# Patient Record
Sex: Male | Born: 1980 | Race: White | Hispanic: No | Marital: Married | Smoking: Never smoker
Health system: Southern US, Community
[De-identification: ages and names within clinical notes are randomized; demographics above are authoritative.]

## PROBLEM LIST (undated history)

## (undated) DIAGNOSIS — I10 Essential (primary) hypertension: Secondary | ICD-10-CM

## (undated) DIAGNOSIS — K5090 Crohn's disease, unspecified, without complications: Secondary | ICD-10-CM

## (undated) HISTORY — PX: FASCIOTOMY: SHX132

## (undated) HISTORY — PX: FACIAL FRACTURE SURGERY: SHX1570

---

## 2003-09-15 ENCOUNTER — Emergency Department (HOSPITAL_COMMUNITY): Admission: EM | Admit: 2003-09-15 | Discharge: 2003-09-15 | Payer: Self-pay | Admitting: Emergency Medicine

## 2006-05-10 ENCOUNTER — Emergency Department: Payer: Self-pay | Admitting: Internal Medicine

## 2006-09-16 ENCOUNTER — Emergency Department: Payer: Self-pay | Admitting: General Practice

## 2007-01-30 ENCOUNTER — Emergency Department: Payer: Self-pay | Admitting: Emergency Medicine

## 2007-05-29 ENCOUNTER — Emergency Department: Payer: Self-pay | Admitting: Emergency Medicine

## 2007-05-30 ENCOUNTER — Emergency Department: Payer: Self-pay | Admitting: Emergency Medicine

## 2007-07-16 ENCOUNTER — Emergency Department: Payer: Self-pay | Admitting: Emergency Medicine

## 2007-07-26 ENCOUNTER — Emergency Department: Payer: Self-pay | Admitting: Emergency Medicine

## 2007-07-30 ENCOUNTER — Inpatient Hospital Stay: Payer: Self-pay | Admitting: Internal Medicine

## 2007-12-19 ENCOUNTER — Emergency Department: Payer: Self-pay | Admitting: Emergency Medicine

## 2008-06-08 ENCOUNTER — Emergency Department: Payer: Self-pay | Admitting: Emergency Medicine

## 2008-08-11 ENCOUNTER — Emergency Department: Payer: Self-pay | Admitting: Emergency Medicine

## 2008-08-26 ENCOUNTER — Emergency Department: Payer: Self-pay | Admitting: Emergency Medicine

## 2008-09-20 ENCOUNTER — Emergency Department: Payer: Self-pay | Admitting: Emergency Medicine

## 2008-10-07 ENCOUNTER — Emergency Department: Payer: Self-pay | Admitting: Emergency Medicine

## 2009-03-05 ENCOUNTER — Emergency Department: Payer: Self-pay | Admitting: Emergency Medicine

## 2009-04-10 ENCOUNTER — Inpatient Hospital Stay: Payer: Self-pay | Admitting: *Deleted

## 2009-07-07 ENCOUNTER — Emergency Department: Payer: Self-pay | Admitting: Emergency Medicine

## 2009-10-12 ENCOUNTER — Emergency Department: Payer: Self-pay | Admitting: Emergency Medicine

## 2010-06-07 ENCOUNTER — Emergency Department: Payer: Self-pay | Admitting: Unknown Physician Specialty

## 2010-06-18 ENCOUNTER — Emergency Department: Payer: Self-pay | Admitting: Emergency Medicine

## 2013-07-25 ENCOUNTER — Emergency Department: Payer: Self-pay | Admitting: Emergency Medicine

## 2013-12-02 ENCOUNTER — Emergency Department: Payer: Self-pay | Admitting: Emergency Medicine

## 2013-12-02 LAB — CK TOTAL AND CKMB (NOT AT ARMC)
CK, Total: 173 U/L
CK-MB: 2.3 ng/mL (ref 0.5–3.6)

## 2013-12-02 LAB — COMPREHENSIVE METABOLIC PANEL
ALK PHOS: 95 U/L
Albumin: 3.6 g/dL (ref 3.4–5.0)
Anion Gap: 7 (ref 7–16)
BUN: 12 mg/dL (ref 7–18)
Bilirubin,Total: 0.9 mg/dL (ref 0.2–1.0)
CO2: 29 mmol/L (ref 21–32)
Calcium, Total: 9.1 mg/dL (ref 8.5–10.1)
Chloride: 101 mmol/L (ref 98–107)
Creatinine: 1.41 mg/dL — ABNORMAL HIGH (ref 0.60–1.30)
EGFR (Non-African Amer.): >60
Glucose: 77 mg/dL (ref 65–99)
OSMOLALITY: 272 (ref 275–301)
POTASSIUM: 4.2 mmol/L (ref 3.5–5.1)
SGOT(AST): 147 U/L — ABNORMAL HIGH (ref 15–37)
SGPT (ALT): 201 U/L — ABNORMAL HIGH (ref 12–78)
Sodium: 137 mmol/L (ref 136–145)
TOTAL PROTEIN: 7.7 g/dL (ref 6.4–8.2)

## 2013-12-02 LAB — CBC
HCT: 48.7 % (ref 40.0–52.0)
HGB: 16.3 g/dL (ref 13.0–18.0)
MCH: 29.1 pg (ref 26.0–34.0)
MCHC: 33.5 g/dL (ref 32.0–36.0)
MCV: 87 fL (ref 80–100)
Platelet: 200 10*3/uL (ref 150–440)
RBC: 5.62 10*6/uL (ref 4.40–5.90)
RDW: 13.4 % (ref 11.5–14.5)
WBC: 6.2 10*3/uL (ref 3.8–10.6)

## 2014-09-12 ENCOUNTER — Ambulatory Visit: Payer: Self-pay

## 2015-04-05 ENCOUNTER — Emergency Department
Admission: EM | Admit: 2015-04-05 | Discharge: 2015-04-05 | Disposition: A | Discharge status: Home / Self Care | Payer: Self-pay | Attending: Emergency Medicine | Admitting: Emergency Medicine

## 2015-04-05 DIAGNOSIS — K0889 Other specified disorders of teeth and supporting structures: Secondary | ICD-10-CM

## 2015-04-05 DIAGNOSIS — K088 Other specified disorders of teeth and supporting structures: Secondary | ICD-10-CM | POA: Insufficient documentation

## 2015-04-05 DIAGNOSIS — Z880 Allergy status to penicillin: Secondary | ICD-10-CM | POA: Insufficient documentation

## 2015-04-05 MED ORDER — OXYCODONE-ACETAMINOPHEN 5-325 MG PO TABS: 1.0000 | ORAL_TABLET | ORAL | Status: DC | PRN

## 2015-04-05 MED ORDER — AMOXICILLIN-POT CLAVULANATE 875-125 MG PO TABS: 1.0000 | ORAL_TABLET | Freq: Two times a day (BID) | ORAL | Status: AC

## 2015-04-05 MED ORDER — OXYCODONE-ACETAMINOPHEN 5-325 MG PO TABS
1.0000 | ORAL_TABLET | Freq: Once | ORAL | Status: AC
  Administered 2015-04-05: 1 via ORAL
  Filled 2015-04-05: qty 1

## 2015-04-05 NOTE — ED Notes (Signed)
Pt informed to return if any life threatening symptoms occur.  

## 2015-04-05 NOTE — Discharge Instructions (Signed)
Please seek medical attention for any high fevers, chest pain, shortness of breath, change in behavior, persistent vomiting, bloody stool or any other new or concerning symptoms.  Dental Pain A tooth ache may be caused by cavities (tooth decay). Cavities expose the nerve of the tooth to air and hot or cold temperatures. It may come from an infection or abscess (also called a boil or furuncle) around your tooth. It is also often caused by dental caries (tooth decay). This causes the pain you are having. DIAGNOSIS  Your caregiver can diagnose this problem by exam. TREATMENT   If caused by an infection, it may be treated with medications which kill germs (antibiotics) and pain medications as prescribed by your caregiver. Take medications as directed.  Only take over-the-counter or prescription medicines for pain, discomfort, or fever as directed by your caregiver.  Whether the tooth ache today is caused by infection or dental disease, you should see your dentist as soon as possible for further care. SEEK MEDICAL CARE IF: The exam and treatment you received today has been provided on an emergency basis only. This is not a substitute for complete medical or dental care. If your problem worsens or new problems (symptoms) appear, and you are unable to meet with your dentist, call or return to this location. SEEK IMMEDIATE MEDICAL CARE IF:   You have a fever.  You develop redness and swelling of your face, jaw, or neck.  You are unable to open your mouth.  You have severe pain uncontrolled by pain medicine. MAKE SURE YOU:   Understand these instructions.  Will watch your condition.  Will get help right away if you are not doing well or get worse. Document Released: 08/31/2005 Document Revised: 11/23/2011 Document Reviewed: 04/18/2008 [facility] Patient Information 2015 [place], [place]. This information is not intended to replace advice given to you by your health care provider. Make sure you  discuss any questions you have with your health care provider.

## 2015-04-05 NOTE — ED Provider Notes (Signed)
[facility] Emergency Department Provider Note  ____________________________________________  Time seen: 0715  I have reviewed the triage vital signs and the nursing notes.   HISTORY  Chief Complaint Dental Pain   History limited by: Not Limited   HPI Tommy Gomez is a 34 y.o. male who presents to the emergency department today because of right upper teeth pain and swelling. He states this been going on for 3 days. It has gradually gotten worse. He describes pain as being severe. It is worse with movement or pressure. He denies any recent trauma to his tooth although this does state that he thinks he cracked his tooth a while ago. He states he does have an appointment with a dentist in 1 week to evaluate for a loose incisor secondary to baseball injury. He complained of some low-grade fever, no nausea, vomiting or diarrhea.     No past medical history on file.  There are no active problems to display for this patient.   No past surgical history on file.  No current outpatient prescriptions on file.  Allergies Penicillins  No family history on file.  Social History History  Substance Use Topics  . Smoking status: Not on file  . Smokeless tobacco: Not on file  . Alcohol Use: Not on file    Review of Systems  Constitutional: Negative for fever. Cardiovascular: Negative for chest pain. Respiratory: Negative for shortness of breath. Gastrointestinal: Negative for abdominal pain, vomiting and diarrhea. Genitourinary: Negative for dysuria. Musculoskeletal: Negative for back pain. Skin: Negative for rash. Neurological: Negative for headaches, focal weakness or numbness.  10-point ROS otherwise negative.  ____________________________________________   PHYSICAL EXAM:  VITAL SIGNS: ED Triage Vitals  Enc Vitals Group     BP 04/05/15 0639 165/109 mmHg     Pulse Rate 04/05/15 0639 93     Resp 04/05/15 0639 17     Temp 04/05/15 0639  98.1 F (36.7 C)     Temp Source 04/05/15 0639 Oral     SpO2 04/05/15 0639 96 %     Weight --      Height --      Head Cir --      Peak Flow --      Pain Score 04/05/15 0625 10   Constitutional: Alert and oriented. Appears slightly uncomfortable. Eyes: Conjunctivae are normal. PERRL. Normal extraocular movements. ENT   Head: Normocephalic and atraumatic.   Nose: No congestion/rhinnorhea.   Mouth/Throat: Mucous membranes are moist. No obvious pharyngeal swelling/tooth swelling. Tender to palpation of the right upper molars. No fluid discharge.    Neck: No stridor. Hematological/Lymphatic/Immunilogical: No cervical lymphadenopathy. Cardiovascular: Normal rate, regular rhythm.   Respiratory: Normal respiratory effort without tachypnea nor retractions. Genitourinary: Deferred Musculoskeletal: Normal range of motion in all extremities.  Neurologic:  Normal speech and language. No gross focal neurologic deficits are appreciated. Speech is normal.  Skin:  Skin is warm, dry and intact. No rash noted. Psychiatric: Mood and affect are normal. Speech and behavior are normal. Patient exhibits appropriate insight and judgment.  ____________________________________________    LABS (pertinent positives/negatives)  None  ____________________________________________   EKG  None  ____________________________________________    RADIOLOGY  None  ____________________________________________   PROCEDURES  Procedure(s) performed: None  Critical Care performed: No  ____________________________________________   INITIAL IMPRESSION / ASSESSMENT AND PLAN / ED COURSE  Pertinent labs & imaging results that were available during my care of the patient were reviewed by me and considered in my medical  decision making (see chart for details).  Patient presents to the emergency department today because of right upper teeth pain. On exam patient does have tenderness to  palpation of the right upper molars without any obvious swelling or tooth injury. This point the patient likely suffered from a dental infection. Will place on antibiotics and given pain medication. Did encourage earlier follow-up with dentist.  ____________________________________________   FINAL CLINICAL IMPRESSION(S) / ED DIAGNOSES  Final diagnoses:  Tooth ache     Nance Pear, MD 04/05/15 [phone]

## 2015-04-05 NOTE — ED Notes (Signed)
Pt with c/o abcess to right mouth, stated he has broken tooth (stated dentist is unable to pull tooth until infection has cleared up). Girlfriend stated swelling started yesterday, and pain woke pt up out of his sleep at 0200.

## 2015-04-05 NOTE — ED Notes (Signed)
Onset of dental pain on the right side upper molar. Right face is swollen and patient states he cannot eat or sleep. Teeth do no meet when mouth is closed.

## 2015-10-08 ENCOUNTER — Encounter: Payer: Self-pay | Admitting: Emergency Medicine

## 2015-10-08 ENCOUNTER — Emergency Department: Payer: Self-pay

## 2015-10-08 ENCOUNTER — Emergency Department
Admission: EM | Admit: 2015-10-08 | Discharge: 2015-10-08 | Disposition: A | Discharge status: Home / Self Care | Payer: Self-pay | Attending: Emergency Medicine | Admitting: Emergency Medicine

## 2015-10-08 DIAGNOSIS — R109 Unspecified abdominal pain: Secondary | ICD-10-CM | POA: Insufficient documentation

## 2015-10-08 DIAGNOSIS — Z880 Allergy status to penicillin: Secondary | ICD-10-CM | POA: Insufficient documentation

## 2015-10-08 DIAGNOSIS — R112 Nausea with vomiting, unspecified: Secondary | ICD-10-CM | POA: Insufficient documentation

## 2015-10-08 DIAGNOSIS — I10 Essential (primary) hypertension: Secondary | ICD-10-CM | POA: Insufficient documentation

## 2015-10-08 HISTORY — DX: Essential (primary) hypertension: I10

## 2015-10-08 HISTORY — DX: Crohn's disease, unspecified, without complications: K50.90

## 2015-10-08 LAB — LIPASE, BLOOD: Lipase: 21 U/L (ref 11–51)

## 2015-10-08 LAB — COMPREHENSIVE METABOLIC PANEL
ALBUMIN: 4.7 g/dL (ref 3.5–5.0)
ALK PHOS: 73 U/L (ref 38–126)
ALT: 59 U/L (ref 17–63)
AST: 44 U/L — ABNORMAL HIGH (ref 15–41)
Anion gap: 9 (ref 5–15)
BILIRUBIN TOTAL: 0.7 mg/dL (ref 0.3–1.2)
BUN: 25 mg/dL — ABNORMAL HIGH (ref 6–20)
CALCIUM: 9.5 mg/dL (ref 8.9–10.3)
CO2: 25 mmol/L (ref 22–32)
Chloride: 106 mmol/L (ref 101–111)
Creatinine, Ser: 1.08 mg/dL (ref 0.61–1.24)
GFR calc Af Amer: >60 mL/min (ref >60)
GFR calc non Af Amer: >60 mL/min (ref >60)
GLUCOSE: 104 mg/dL — AB (ref 65–99)
POTASSIUM: 4 mmol/L (ref 3.5–5.1)
Sodium: 140 mmol/L (ref 135–145)
TOTAL PROTEIN: 8.1 g/dL (ref 6.5–8.1)

## 2015-10-08 LAB — CBC
HEMATOCRIT: 45.9 % (ref 40.0–52.0)
Hemoglobin: 16.1 g/dL (ref 13.0–18.0)
MCH: 30.9 pg (ref 26.0–34.0)
MCHC: 35.1 g/dL (ref 32.0–36.0)
MCV: 88.1 fL (ref 80.0–100.0)
Platelets: 213 10*3/uL (ref 150–440)
RBC: 5.21 MIL/uL (ref 4.40–5.90)
RDW: 12.3 % (ref 11.5–14.5)
WBC: 15 10*3/uL — ABNORMAL HIGH (ref 3.8–10.6)

## 2015-10-08 MED ORDER — IOHEXOL 240 MG/ML SOLN
25.0000 mL | Freq: Once | INTRAMUSCULAR | Status: AC | PRN
  Administered 2015-10-08: 25 mL via ORAL
  Filled 2015-10-08: qty 25

## 2015-10-08 MED ORDER — ONDANSETRON HCL 4 MG PO TABS: 4.0000 mg | ORAL_TABLET | Freq: Three times a day (TID) | ORAL | Status: AC | PRN

## 2015-10-08 MED ORDER — PROMETHAZINE HCL 25 MG/ML IJ SOLN
INTRAMUSCULAR | Status: AC
  Administered 2015-10-08: 25 mg via INTRAVENOUS
  Filled 2015-10-08: qty 1

## 2015-10-08 MED ORDER — DICYCLOMINE HCL 20 MG PO TABS: 20.0000 mg | ORAL_TABLET | Freq: Three times a day (TID) | ORAL | Status: AC | PRN

## 2015-10-08 MED ORDER — IOHEXOL 300 MG/ML  SOLN
100.0000 mL | Freq: Once | INTRAMUSCULAR | Status: AC | PRN
  Administered 2015-10-08: 100 mL via INTRAVENOUS
  Filled 2015-10-08: qty 100

## 2015-10-08 MED ORDER — SODIUM CHLORIDE 0.9 % IV BOLUS (SEPSIS)
1000.0000 mL | Freq: Once | INTRAVENOUS | Status: AC
  Administered 2015-10-08: 1000 mL via INTRAVENOUS

## 2015-10-08 MED ORDER — PROMETHAZINE HCL 25 MG/ML IJ SOLN
25.0000 mg | Freq: Once | INTRAMUSCULAR | Status: AC
  Administered 2015-10-08: 25 mg via INTRAVENOUS

## 2015-10-08 MED ORDER — ONDANSETRON HCL 4 MG/2ML IJ SOLN
4.0000 mg | Freq: Once | INTRAMUSCULAR | Status: AC
  Administered 2015-10-08: 4 mg via INTRAVENOUS

## 2015-10-08 NOTE — ED Notes (Signed)
Pt to ed with c/o abd pain and n/v/d that started about 1 week ago intermittently.  Pt also reports dizziness, sob associated with the pain.

## 2015-10-08 NOTE — ED Provider Notes (Signed)
[facility] Emergency Department Provider Note    ____________________________________________  Time seen: 1500  I have reviewed the triage vital signs and the nursing notes.   HISTORY  Chief Complaint Abdominal Pain   History limited by: Not Limited   HPI Tommy Gomez is a 35 y.o. male who presents to the emergency department because of concerns for abdominal pain, nausea and vomiting. He states that the symptoms started roughly 4-5 days ago. They have been somewhat intermittent. He states the pain becomes severe. He describes it is pulling in quality. Will also be sharp at times. He is not as anything that particularly makes the pain worse. He has had associated symptoms of nausea and vomiting. He states he has noticed some blood in the vomit. He denies any significant change to his defecation. He has had subjective fevers at home. He states he had similar symptoms roughly 6 years in the past and was diagnosed with IBS.   Past Medical History  Diagnosis Date  . Crohn disease ([place])   . Hypertension     There are no active problems to display for this patient.   History reviewed. No pertinent past surgical history.  Current Outpatient Rx  Name  Route  Sig  Dispense  Refill  . oxyCODONE-acetaminophen (ROXICET) 5-325 MG per tablet   Oral   Take 1 tablet by mouth every 4 (four) hours as needed for severe pain.   15 tablet   0     Allergies Penicillins  History reviewed. No pertinent family history.  Social History Social History  Substance Use Topics  . Smoking status: Never Smoker   . Smokeless tobacco: None  . Alcohol Use: Yes    Review of Systems  Constitutional: Negative for fever. Cardiovascular: Negative for chest pain. Respiratory: Negative for shortness of breath. Gastrointestinal: Positive for abdominal pain, vomiting Neurological: Negative for headaches, focal weakness or numbness.  10-point ROS otherwise  negative.  ____________________________________________   PHYSICAL EXAM:  VITAL SIGNS: ED Triage Vitals  Enc Vitals Group     BP 10/08/15 1337 137/92 mmHg     Pulse Rate 10/08/15 1337 98     Resp 10/08/15 1337 18     Temp 10/08/15 1337 98.2 F (36.8 C)     Temp Source 10/08/15 1337 Oral     SpO2 10/08/15 1337 97 %     Weight 10/08/15 1337 240 lb (108.863 kg)     Height 10/08/15 1337 6' 1"  (1.854 m)     Head Cir --      Peak Flow --      Pain Score 10/08/15 1337 10   Constitutional: Alert and oriented. Well appearing and in no distress. Eyes: Conjunctivae are normal. PERRL. Normal extraocular movements. ENT   Head: Normocephalic and atraumatic.   Nose: No congestion/rhinnorhea.   Mouth/Throat: Mucous membranes are moist.   Neck: No stridor. Hematological/Lymphatic/Immunilogical: No cervical lymphadenopathy. Cardiovascular: Normal rate, regular rhythm.  No murmurs, rubs, or gallops. Respiratory: Normal respiratory effort without tachypnea nor retractions. Breath sounds are clear and equal bilaterally. No wheezes/rales/rhonchi. Gastrointestinal: Soft and minimally tender somewhat diffusely however worse in the right lower quadrant. No distention. There is no CVA tenderness. Genitourinary: Deferred Musculoskeletal: Normal range of motion in all extremities. No joint effusions.  No lower extremity tenderness nor edema. Neurologic:  Normal speech and language. No gross focal neurologic deficits are appreciated.  Skin:  Skin is warm, dry and intact. No rash noted. Psychiatric: Mood and affect are normal.  Speech and behavior are normal. Patient exhibits appropriate insight and judgment.  ____________________________________________    LABS (pertinent positives/negatives)  Labs Reviewed  COMPREHENSIVE METABOLIC PANEL - Abnormal; Notable for the following:    Glucose, Bld 104 (*)    BUN 25 (*)    AST 44 (*)    All other components within normal limits  CBC -  Abnormal; Notable for the following:    WBC 15.0 (*)    All other components within normal limits  LIPASE, BLOOD  URINALYSIS COMPLETEWITH MICROSCOPIC ([facility] ONLY)     ____________________________________________   EKG  I, Nance Pear, attending physician, personally viewed and interpreted this EKG  EKG Time: 1340 Rate: 105 Rhythm: sinus tachycardia Axis: normal Intervals: qtc 452 QRS: narrow ST changes: no st elevation Impression: sinus tachycardia, otherwise normal ____________________________________________    RADIOLOGY  CT abd/pel IMPRESSION: Normal appendix.  No acute findings in the abdomen or pelvis.   ____________________________________________   PROCEDURES  Procedure(s) performed: None  Critical Care performed: No  ____________________________________________   INITIAL IMPRESSION / ASSESSMENT AND PLAN / ED COURSE  Pertinent labs & imaging results that were available during my care of the patient were reviewed by me and considered in my medical decision making (see chart for details).  Patient presented to the emergency department today because of concerns for abdominal pain. Patient does state he has a history of IBS. On physical exam slightly tender to palpation diffusely however worse in the right lower quadrant. Patient additionally with a leukocytosis given these 2 findings a CT abdomen and pelvis was obtained to evaluate for appendicitis. This was negative. No obvious etiology of the pain based on CT scan. I do wonder if patient is having a slight flare of his IBS. Will plan on giving Bentyl and antibiotics. Will give  primary care follow-up.  ____________________________________________   FINAL CLINICAL IMPRESSION(S) / ED DIAGNOSES  Final diagnoses:  Abdominal pain, unspecified abdominal location     Nance Pear, MD 10/08/15 [name]

## 2015-10-08 NOTE — Discharge Instructions (Signed)
Please seek medical attention for any high fevers, chest pain, shortness of breath, change in behavior, persistent vomiting, bloody stool or any other new or concerning symptoms.   Abdominal Pain, Adult Many things can cause abdominal pain. Usually, abdominal pain is not caused by a disease and will improve without treatment. It can often be observed and treated at home. Your health care provider will do a physical exam and possibly order blood tests and X-rays to help determine the seriousness of your pain. However, in many cases, more time must pass before a clear cause of the pain can be found. Before that point, your health care provider may not know if you need more testing or further treatment. HOME CARE INSTRUCTIONS Monitor your abdominal pain for any changes. The following actions may help to alleviate any discomfort you are experiencing:  Only take over-the-counter or prescription medicines as directed by your health care provider.  Do not take laxatives unless directed to do so by your health care provider.  Try a clear liquid diet (broth, tea, or water) as directed by your health care provider. Slowly move to a bland diet as tolerated. SEEK MEDICAL CARE IF:  You have unexplained abdominal pain.  You have abdominal pain associated with nausea or diarrhea.  You have pain when you urinate or have a bowel movement.  You experience abdominal pain that wakes you in the night.  You have abdominal pain that is worsened or improved by eating food.  You have abdominal pain that is worsened with eating fatty foods.  You have a fever. SEEK IMMEDIATE MEDICAL CARE IF:  Your pain does not go away within 2 hours.  You keep throwing up (vomiting).  Your pain is felt only in portions of the abdomen, such as the right side or the left lower portion of the abdomen.  You pass bloody or black tarry stools. MAKE SURE YOU:  Understand these instructions.  Will watch your  condition.  Will get help right away if you are not doing well or get worse.   This information is not intended to replace advice given to you by your health care provider. Make sure you discuss any questions you have with your health care provider.   Document Released: 06/10/2005 Document Revised: 05/22/2015 Document Reviewed: 05/10/2013 Elsevier Interactive Patient Education [employer].

## 2015-11-23 ENCOUNTER — Emergency Department
Admission: EM | Admit: 2015-11-23 | Discharge: 2015-11-23 | Disposition: A | Discharge status: Home / Self Care | Payer: Self-pay | Attending: Emergency Medicine | Admitting: Emergency Medicine

## 2015-11-23 DIAGNOSIS — I10 Essential (primary) hypertension: Secondary | ICD-10-CM | POA: Insufficient documentation

## 2015-11-23 DIAGNOSIS — K5090 Crohn's disease, unspecified, without complications: Secondary | ICD-10-CM | POA: Insufficient documentation

## 2015-11-23 DIAGNOSIS — L02414 Cutaneous abscess of left upper limb: Secondary | ICD-10-CM | POA: Insufficient documentation

## 2015-11-23 MED ORDER — LIDOCAINE-EPINEPHRINE 2 %-1:100000 IJ SOLN
1.7000 mL | Freq: Once | INTRAMUSCULAR | Status: AC
  Administered 2015-11-23: 1.7 mL

## 2015-11-23 MED ORDER — SULFAMETHOXAZOLE-TRIMETHOPRIM 800-160 MG PO TABS: 1.0000 | ORAL_TABLET | Freq: Two times a day (BID) | ORAL | Status: DC

## 2015-11-23 MED ORDER — LIDOCAINE-EPINEPHRINE 2 %-1:100000 IJ SOLN
INTRAMUSCULAR | Status: AC
  Administered 2015-11-23: 1.7 mL
  Filled 2015-11-23: qty 1.7

## 2015-11-23 NOTE — ED Provider Notes (Signed)
CSN: [id number]     Arrival date & time 11/23/15  1517 History   None    Chief Complaint  Patient presents with  . Abscess     (Consider location/radiation/quality/duration/timing/severity/associated sxs/prior Treatment) HPI  35 year old male presents emergency department for evaluation of left painful forearm. He has had a area of soft tissue swelling consistent with abscess formation for one week. States he has been taking amoxicillin himself and has seen some improvement. Overall swelling has improved slightly. His pain is moderate. Abscess is located along the ulnar aspect of the proximal forearm medially. There has been no drainage. He denies any numbness or tingling. Denies any IV drug use.  Past Medical History  Diagnosis Date  . Crohn disease ([place])   . Hypertension    No past surgical history on file. No family history on file. Social History  Substance Use Topics  . Smoking status: Never Smoker   . Smokeless tobacco: Not on file  . Alcohol Use: Yes    Review of Systems  Constitutional: Negative.  Negative for fever, chills, activity change and appetite change.  HENT: Negative for congestion, ear pain, mouth sores, rhinorrhea, sinus pressure, sore throat and trouble swallowing.   Eyes: Negative for photophobia, pain and discharge.  Respiratory: Negative for cough, chest tightness and shortness of breath.   Cardiovascular: Negative for chest pain and leg swelling.  Gastrointestinal: Negative for nausea, vomiting, abdominal pain, diarrhea and abdominal distention.  Genitourinary: Negative for dysuria and difficulty urinating.  Musculoskeletal: Negative for back pain, arthralgias and gait problem.  Skin: Positive for wound. Negative for color change and rash.  Neurological: Negative for dizziness and headaches.  Hematological: Negative for adenopathy.  Psychiatric/Behavioral: Negative for behavioral problems and agitation.      Allergies  Penicillins  Home  Medications   Prior to Admission medications   Medication Sig Start Date End Date Taking? Authorizing Provider  dicyclomine (BENTYL) 20 MG tablet Take 1 tablet (20 mg total) by mouth 3 (three) times daily as needed (abdominal pain). 10/08/15 10/07/16  Nance Pear, MD  ondansetron (ZOFRAN) 4 MG tablet Take 1 tablet (4 mg total) by mouth every 8 (eight) hours as needed. 10/08/15   Nance Pear, MD  oxyCODONE-acetaminophen (ROXICET) 5-325 MG per tablet Take 1 tablet by mouth every 4 (four) hours as needed for severe pain. 04/05/15   Nance Pear, MD  sulfamethoxazole-trimethoprim (BACTRIM DS,SEPTRA DS) 800-160 MG tablet Take 1 tablet by mouth 2 (two) times daily. 11/23/15   Duanne Guess, PA-C   BP 140/94 mmHg  Pulse 95  Temp(Src) 98.4 F (36.9 C) (Oral)  Resp 18  Ht 6' 1"  (1.854 m)  Wt 107.956 kg  BMI 31.41 kg/m2  SpO2 100% Physical Exam  Constitutional: He is oriented to person, place, and time. He appears well-developed and well-nourished.  HENT:  Head: Normocephalic and atraumatic.  Eyes: Conjunctivae and EOM are normal. Pupils are equal, round, and reactive to light.  Neck: Normal range of motion. Neck supple.  Cardiovascular: Normal rate, regular rhythm, normal heart sounds and intact distal pulses.   Pulmonary/Chest: Effort normal and breath sounds normal. No respiratory distress.  Musculoskeletal: Normal range of motion. He exhibits no edema or tenderness.  Full range of motion left upper extremity with no discomfort.  Neurological: He is alert and oriented to person, place, and time.  Skin: Skin is warm and dry.  Left proximal forearm, ulnar aspect shows a 4 x 4 centimeter area of induration and erythema. There is no  streaking. No signs of IV drug abuse.  Psychiatric: He has a normal mood and affect. His behavior is normal. Judgment and thought content normal.    ED Course  Procedures (including critical care time) INCISION AND DRAINAGE Performed by: Feliberto Gottron Consent: Verbal consent obtained. Risks and benefits: risks, benefits and alternatives were discussed Type: abscess  Body area: Left forearm  Anesthesia: local infiltration  Incision was made with a scalpel.  Local anesthetic: lidocaine 1% with epinephrine  Anesthetic total: 1.0 ml  Complexity: complex Blunt dissection to break up loculations  Drainage: purulent  Drainage amount:  mild  Packing material: 1/4 in iodoform gauze  Patient tolerance: Patient tolerated the procedure well with no immediate complications.      Labs Review Labs Reviewed - No data to display  Imaging Review No results found. I have personally reviewed and evaluated these images and lab results as part of my medical decision-making.   EKG Interpretation None      MDM   Final diagnoses:  Abscess of left arm     35 year old male with left forearm abscess for one week. Denies any IV drug abuse. Today, erythema and induration 4 cm in diameter. Skin was marked with a skin pen. He agreed and consented to a percentage and drainage procedure. Minimal, 2-3 cc of purulent drainage was expressed. Wound was packed with iodoform. Patient was neurovascularly intact after the procedure. Patient will follow-up in 2-3 days for wound check and packing removal. He will take Bactrim DS 1 tab by mouth twice a day for 7 days. Turn to the ER for any worsening symptoms or urgent changes in his health.    Duanne Guess, PA-C 11/23/15 4035  Lavonia Drafts, MD 11/23/15 [phone]

## 2015-11-23 NOTE — ED Notes (Addendum)
Pt reports red swollen painful area to left forearm that has been there about a week. Denies drainage

## 2015-11-23 NOTE — Discharge Instructions (Signed)
Abscess An abscess is an infected area that contains a collection of pus and debris.It can occur in almost any part of the body. An abscess is also known as a furuncle or boil. CAUSES  An abscess occurs when tissue gets infected. This can occur from blockage of oil or sweat glands, infection of hair follicles, or a minor injury to the skin. As the body tries to fight the infection, pus collects in the area and creates pressure under the skin. This pressure causes pain. People with weakened immune systems have difficulty fighting infections and get certain abscesses more often.  SYMPTOMS Usually an abscess develops on the skin and becomes a painful mass that is red, warm, and tender. If the abscess forms under the skin, you may feel a moveable soft area under the skin. Some abscesses break open (rupture) on their own, but most will continue to get worse without care. The infection can spread deeper into the body and eventually into the bloodstream, causing you to feel ill.  DIAGNOSIS  Your caregiver will take your medical history and perform a physical exam. A sample of fluid may also be taken from the abscess to determine what is causing your infection. TREATMENT  Your caregiver may prescribe antibiotic medicines to fight the infection. However, taking antibiotics alone usually does not cure an abscess. Your caregiver may need to make a small cut (incision) in the abscess to drain the pus. In some cases, gauze is packed into the abscess to reduce pain and to continue draining the area. HOME CARE INSTRUCTIONS   Only take over-the-counter or prescription medicines for pain, discomfort, or fever as directed by your caregiver.  If you were prescribed antibiotics, take them as directed. Finish them even if you start to feel better.  If gauze is used, follow your caregiver's directions for changing the gauze.  To avoid spreading the infection:  Keep your draining abscess covered with a  bandage.  Wash your hands well.  Do not share personal care items, towels, or whirlpools with others.  Avoid skin contact with others.  Keep your skin and clothes clean around the abscess.  Keep all follow-up appointments as directed by your caregiver. SEEK MEDICAL CARE IF:   You have increased pain, swelling, redness, fluid drainage, or bleeding.  You have muscle aches, chills, or a general ill feeling.  You have a fever. MAKE SURE YOU:   Understand these instructions.  Will watch your condition.  Will get help right away if you are not doing well or get worse.   This information is not intended to replace advice given to you by your health care provider. Make sure you discuss any questions you have with your health care provider.   Document Released: 06/10/2005 Document Revised: 03/01/2012 Document Reviewed: 11/13/2011 Elsevier Interactive Patient Education 2016 Else[employer]  Incision and Drainage Incision and drainage is a procedure in which a sac-like structure (cystic structure) is opened and drained. The area to be drained usually contains material such as pus, fluid, or blood.  LET YOUR CAREGIVER KNOW ABOUT:   Allergies to medicine.  Medicines taken, including vitamins, herbs, eyedrops, over-the-counter medicines, and creams.  Use of steroids (by mouth or creams).  Previous problems with anesthetics or numbing medicines.  History of bleeding problems or blood clots.  Previous surgery.  Other health problems, including diabetes and kidney problems.  Possibility of pregnancy, if this applies. RISKS AND COMPLICATIONS  Pain.  Bleeding.  Scarring.  Infection. BEFORE THE PROCEDURE  You may need to have an ultrasound or other imaging tests to see how large or deep your cystic structure is. Blood tests may also be used to determine if you have an infection or how severe the infection is. You may need to have a tetanus shot. PROCEDURE  The affected area  is cleaned with a cleaning fluid. The cyst area will then be numbed with a medicine (local anesthetic). A small incision will be made in the cystic structure. A syringe or catheter may be used to drain the contents of the cystic structure, or the contents may be squeezed out. The area will then be flushed with a cleansing solution. After cleansing the area, it is often gently packed with a gauze or another wound dressing. Once it is packed, it will be covered with gauze and tape or some other type of wound dressing. AFTER THE PROCEDURE   Often, you will be allowed to go home right after the procedure.  You may be given antibiotic medicine to prevent or heal an infection.  If the area was packed with gauze or some other wound dressing, you will likely need to come back in 1 to 2 days to get it removed.  The area should heal in about 14 days.   This information is not intended to replace advice given to you by your health care provider. Make sure you discuss any questions you have with your health care provider.   Document Released: 02/24/2001 Document Revised: 03/01/2012 Document Reviewed: 10/26/2011 Elsevier Interactive Patient Education [employer].

## 2016-04-26 ENCOUNTER — Emergency Department
Admission: EM | Admit: 2016-04-26 | Discharge: 2016-04-26 | Disposition: A | Discharge status: Home / Self Care | Payer: Self-pay | Attending: Emergency Medicine | Admitting: Emergency Medicine

## 2016-04-26 ENCOUNTER — Encounter: Payer: Self-pay | Admitting: *Deleted

## 2016-04-26 DIAGNOSIS — W57XXXA Bitten or stung by nonvenomous insect and other nonvenomous arthropods, initial encounter: Secondary | ICD-10-CM

## 2016-04-26 DIAGNOSIS — T63441A Toxic effect of venom of bees, accidental (unintentional), initial encounter: Secondary | ICD-10-CM | POA: Insufficient documentation

## 2016-04-26 DIAGNOSIS — F17200 Nicotine dependence, unspecified, uncomplicated: Secondary | ICD-10-CM | POA: Insufficient documentation

## 2016-04-26 DIAGNOSIS — I10 Essential (primary) hypertension: Secondary | ICD-10-CM | POA: Insufficient documentation

## 2016-04-26 DIAGNOSIS — T63481A Toxic effect of venom of other arthropod, accidental (unintentional), initial encounter: Secondary | ICD-10-CM

## 2016-04-26 MED ORDER — PREDNISONE 10 MG PO TABS: ORAL_TABLET | ORAL | 0 refills | Status: DC

## 2016-04-26 MED ORDER — FAMOTIDINE 20 MG PO TABS: 20.0000 mg | ORAL_TABLET | Freq: Every day | ORAL | 0 refills | Status: AC

## 2016-04-26 MED ORDER — CETIRIZINE HCL 10 MG PO TABS: 10.0000 mg | ORAL_TABLET | Freq: Every day | ORAL | 0 refills | Status: DC

## 2016-04-26 MED ORDER — FAMOTIDINE IN NACL 20-0.9 MG/50ML-% IV SOLN
20.0000 mg | Freq: Once | INTRAVENOUS | Status: AC
  Administered 2016-04-26: 20 mg via INTRAVENOUS
  Filled 2016-04-26: qty 50

## 2016-04-26 MED ORDER — EPINEPHRINE 0.3 MG/0.3ML IJ SOAJ: 0.3000 mg | Freq: Once | INTRAMUSCULAR | 0 refills | Status: AC

## 2016-04-26 MED ORDER — METHYLPREDNISOLONE SODIUM SUCC 125 MG IJ SOLR
80.0000 mg | Freq: Once | INTRAMUSCULAR | Status: AC
  Administered 2016-04-26: 80 mg via INTRAVENOUS
  Filled 2016-04-26: qty 2

## 2016-04-26 NOTE — ED Triage Notes (Signed)
Pt was stung on right forearm Thursday by a bee, right forearm is red and swollen, no other symptoms noted

## 2016-04-26 NOTE — ED Provider Notes (Signed)
[facility] Emergency Department Provider Note  ____________________________________________  Time seen: Approximately 2:18 PM  I have reviewed the triage vital signs and the nursing notes.   HISTORY  Chief Complaint Insect Bite    HPI Tommy Gomez is a 35 y.o. male , NAD, presents to the emergency department with 3 day history of yellow jacket sting to the right forearm. Patient states he was at work and moving brush when he saw a yellowjacket sting him on his right forearm. States he had a small area of redness at that time. Since then the area of redness has expanded and feels tight. Patient has taken Benadryl 1-2 times daily since the incident. Notes that he had one episode of emesis on Thursday and Friday but none since. Has had no difficulty breathing or swallowing. Has been eating and drinking well. Denies any chest pain, shortness breath, abdominal pain, numbness, weakness, tingling. Has noted no swelling about his lips/tongue/throat. History of allergies to stings in the past.   Past Medical History:  Diagnosis Date  . Crohn disease ([place])   . Hypertension     There are no active problems to display for this patient.   History reviewed. No pertinent surgical history.  Prior to Admission medications   Medication Sig Start Date End Date Taking? Authorizing Provider  cetirizine (ZYRTEC) 10 MG tablet Take 1 tablet (10 mg total) by mouth daily. 04/26/16   Marikay Roads L Saxon Crosby, PA-C  dicyclomine (BENTYL) 20 MG tablet Take 1 tablet (20 mg total) by mouth 3 (three) times daily as needed (abdominal pain). 10/08/15 10/07/16  Nance Pear, MD  EPINEPHrine 0.3 mg/0.3 mL IJ SOAJ injection Inject 0.3 mLs (0.3 mg total) into the muscle once. 04/26/16 04/26/16  Moris Ratchford L Jamiyla Ishee, PA-C  famotidine (PEPCID) 20 MG tablet Take 1 tablet (20 mg total) by mouth daily. 04/26/16 05/26/16  Shanyla Marconi L Michelle Wnek, PA-C  ondansetron (ZOFRAN) 4 MG tablet Take 1 tablet (4 mg total) by mouth every 8  (eight) hours as needed. 10/08/15   Nance Pear, MD  oxyCODONE-acetaminophen (ROXICET) 5-325 MG per tablet Take 1 tablet by mouth every 4 (four) hours as needed for severe pain. 04/05/15   Nance Pear, MD  predniSONE (DELTASONE) 10 MG tablet Take a daily regimen of 6,5,4,3,2,1 04/26/16   Lathyn Griggs L Shaquana Buel, PA-C  sulfamethoxazole-trimethoprim (BACTRIM DS,SEPTRA DS) 800-160 MG tablet Take 1 tablet by mouth 2 (two) times daily. 11/23/15   Duanne Guess, PA-C    Allergies Penicillins  No family history on file.  Social History Social History  Substance Use Topics  . Smoking status: Never Smoker  . Smokeless tobacco: Current User  . Alcohol use Yes     Review of Systems  Constitutional: No fever/chills, Fatigue Eyes: No visual changes. No discharge, Swelling ENT: No sore throat, Swelling about throat/tongue/lips, no difficulty swallowing. Cardiovascular: No chest pain, palpitations. Respiratory: No cough. No shortness of breath. No wheezing.  Gastrointestinal: Positive vomiting 2 that has resolved. No abdominal pain.  No nausea.   Musculoskeletal: Negative for back pain.  Skin: Positive redness right forearm. Negative for rash. Neurological: Negative for headaches, focal weakness or numbness. No tingling. 10-point ROS otherwise negative.  ____________________________________________   PHYSICAL EXAM:  VITAL SIGNS: ED Triage Vitals  Enc Vitals Group     BP 04/26/16 1404 (!) 149/88     Pulse Rate 04/26/16 1404 79     Resp 04/26/16 1404 18     Temp 04/26/16 1404 97.6 F (36.4 C)  Temp Source 04/26/16 1404 Oral     SpO2 04/26/16 1404 100 %     Weight 04/26/16 1405 242 lb (109.8 kg)     Height 04/26/16 1405 6' (1.829 m)     Head Circumference --      Peak Flow --      Pain Score --      Pain Loc --      Pain Edu? --      Excl. in [place]? --      Constitutional: Alert and oriented. Well appearing and in no acute distress. Eyes: Conjunctivae are normal without icterus  or injection Head: Atraumatic. ENT:      Ears:       Nose: No congestion/rhinnorhea.      Mouth/Throat: Mucous membranes are moist. Airways patent. No swelling about the lips/tongue/throat. Neck: No stridor. No carotid bruits. Supple with full range of motion. Hematological/Lymphatic/Immunilogical: No cervical lymphadenopathy. Cardiovascular: Normal rate, regular rhythm. Normal S1 and S2.  Good peripheral circulation with 2+ pulses noted in the right upper extremity. Capillary refill is brisk in all digits of the right hand. Respiratory: Normal respiratory effort without tachypnea or retractions. Lungs CTAB with breath sounds noted in all lung fields. No wheeze, rhonchi, rales. Gastrointestinal: Soft and nontender. No distention. No CVA tenderness. Musculoskeletal: Full range of motion of the right shoulder, elbow, wrist, hand and fingers without pain or difficulty. Compartments are soft about the right upper extremity. Grip strength is 5 out of 5 in bilateral upper extremities. No lower extremity tenderness nor edema.  No joint effusions. Neurologic:  Normal speech and language. No gross focal neurologic deficits are appreciated.  Skin:  5 cm annular area of erythema noted about the anterior, proximal right forearm with a pinpoint central punctate lesion. Area is slightly warm to palpate as well as indurated but no fluctuance or oozing wounds are noted. Skin is warm, dry and intact. No rash noted. Psychiatric: Mood and affect are normal. Speech and behavior are normal. Patient exhibits appropriate insight and judgement.   ____________________________________________   LABS  None ____________________________________________  EKG  None ____________________________________________  RADIOLOGY  None ____________________________________________    PROCEDURES  Procedure(s) performed: None   Procedures   Medications  methylPREDNISolone sodium succinate (SOLU-MEDROL) 125 mg/2 mL  injection 80 mg (80 mg Intravenous Given 04/26/16 1502)  famotidine (PEPCID) IVPB 20 mg premix (0 mg Intravenous Stopped 04/26/16 1554)   Patient tolerated medications well without side effects  ____________________________________________   INITIAL IMPRESSION / ASSESSMENT AND PLAN / ED COURSE  Pertinent labs & imaging results that were available during my care of the patient were reviewed by me and considered in my medical decision making (see chart for details).  Clinical Course    Patient's diagnosis is consistent with Insect sting allergy with local reaction. Patient will be discharged home with prescriptions for prednisone, Zyrtec and Pepcid to take as directed. Patient may also take 50 mg of Benadryl every 4-6 hours as needed for itching. Apply cool compresses to the area as needed. Patient was also given a prescription for an EpiPen to use as needed for severe reactions. 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 Patient is to follow up with [facility] if symptoms persist past this treatment course. Patient is given ED precautions to return to the ED for any worsening or new symptoms.    ____________________________________________  FINAL CLINICAL IMPRESSION(S) / ED DIAGNOSES  Final diagnoses:  Insect bite  Insect sting allergy, current reaction, accidental or unintentional, initial encounter      NEW MEDICATIONS STARTED DURING THIS VISIT:  Discharge Medication List as of 04/26/2016  3:58 PM    START taking these medications   Details  cetirizine (ZYRTEC) 10 MG tablet Take 1 tablet (10 mg total) by mouth daily., Starting Sun 04/26/2016, Print    EPINEPHrine 0.3 mg/0.3 mL IJ SOAJ injection Inject 0.3 mLs (0.3 mg total) into the muscle once., Starting Sun 04/26/2016, Print    famotidine (PEPCID) 20 MG tablet Take 1 tablet (20 mg total) by mouth daily., Starting Sun 04/26/2016, Until Tue 05/26/2016, Print    predniSONE (DELTASONE) 10 MG  tablet Take a daily regimen of 6,5,4,3,2,1, Print             Braxton Feathers, PA-C 04/26/16 1641    Harvest Dark, MD 04/27/16 2336

## 2016-08-02 ENCOUNTER — Encounter: Payer: Self-pay | Admitting: Emergency Medicine

## 2016-08-02 ENCOUNTER — Emergency Department
Admission: EM | Admit: 2016-08-02 | Discharge: 2016-08-02 | Disposition: A | Discharge status: Home / Self Care | Payer: Self-pay | Attending: Student in an Organized Health Care Education/Training Program | Admitting: Student in an Organized Health Care Education/Training Program

## 2016-08-02 DIAGNOSIS — Z79899 Other long term (current) drug therapy: Secondary | ICD-10-CM | POA: Insufficient documentation

## 2016-08-02 DIAGNOSIS — R51 Headache: Secondary | ICD-10-CM | POA: Insufficient documentation

## 2016-08-02 DIAGNOSIS — F17200 Nicotine dependence, unspecified, uncomplicated: Secondary | ICD-10-CM | POA: Insufficient documentation

## 2016-08-02 DIAGNOSIS — G8929 Other chronic pain: Secondary | ICD-10-CM

## 2016-08-02 DIAGNOSIS — R519 Headache, unspecified: Secondary | ICD-10-CM

## 2016-08-02 DIAGNOSIS — I10 Essential (primary) hypertension: Secondary | ICD-10-CM | POA: Insufficient documentation

## 2016-08-02 MED ORDER — AMOXICILLIN-POT CLAVULANATE 875-125 MG PO TABS: 1.0000 | ORAL_TABLET | Freq: Two times a day (BID) | ORAL | 0 refills | Status: AC

## 2016-08-02 MED ORDER — BUTALBITAL-APAP-CAFFEINE 50-325-40 MG PO TABS: 1.0000 | ORAL_TABLET | Freq: Four times a day (QID) | ORAL | 0 refills | Status: DC | PRN

## 2016-08-02 MED ORDER — DEXAMETHASONE 4 MG PO TABS
6.0000 mg | ORAL_TABLET | Freq: Once | ORAL | Status: AC
  Administered 2016-08-02: 6 mg via ORAL
  Filled 2016-08-02: qty 1.5

## 2016-08-02 NOTE — ED Notes (Signed)
States took some of his mom bp meds two days ago and it helped the head pressure.

## 2016-08-02 NOTE — ED Notes (Signed)
Patient states that he has had headache and blurry vision. Patient denies numbness and tingling, fevers and chills. Patient does have nasal congestion. Patient has right sided facial tenderness on palpation

## 2016-08-02 NOTE — ED Provider Notes (Signed)
[facility] Emergency Department Provider Note    First MD Initiated Contact with Patient 08/02/16 1649     (approximate)  I have reviewed the triage vital signs and the nursing notes.   HISTORY  Chief Complaint Headache    HPI Tommy Gomez is a 35 y.o. male with 2 weeks of intermittent headache that occurs more frequently while at work. Patient works for a Adult nurse. States there is no been no sudden onset headache. States is typically on the right side radiating through to his posterior scalp. I does have intermittent blurry vision during the headache attacks. Does not have any blurry vision at this time. No associated nausea or vomiting. States she's been taking Excedrin Aleve and Mucinex with some temporary relief but the headaches come back. Patient states his blood pressures also been elevated and took one of his mother's blood pressure medication and felt that that did have some improvement in his headache. He denies any numbness or tingling at this time.   Past Medical History:  Diagnosis Date  . Crohn disease ([place])   . Hypertension    History reviewed. No pertinent family history. History reviewed. No pertinent surgical history. There are no active problems to display for this patient.     Prior to Admission medications   Medication Sig Start Date End Date Taking? Authorizing Provider  cetirizine (ZYRTEC) 10 MG tablet Take 1 tablet (10 mg total) by mouth daily. 04/26/16   Jami L Hagler, PA-C  dicyclomine (BENTYL) 20 MG tablet Take 1 tablet (20 mg total) by mouth 3 (three) times daily as needed (abdominal pain). 10/08/15 10/07/16  Nance Pear, MD  famotidine (PEPCID) 20 MG tablet Take 1 tablet (20 mg total) by mouth daily. 04/26/16 05/26/16  Jami L Hagler, PA-C  ondansetron (ZOFRAN) 4 MG tablet Take 1 tablet (4 mg total) by mouth every 8 (eight) hours as needed. 10/08/15   Nance Pear, MD  oxyCODONE-acetaminophen (ROXICET) 5-325 MG per  tablet Take 1 tablet by mouth every 4 (four) hours as needed for severe pain. 04/05/15   Nance Pear, MD  predniSONE (DELTASONE) 10 MG tablet Take a daily regimen of 6,5,4,3,2,1 04/26/16   Jami L Hagler, PA-C  sulfamethoxazole-trimethoprim (BACTRIM DS,SEPTRA DS) 800-160 MG tablet Take 1 tablet by mouth 2 (two) times daily. 11/23/15   Duanne Guess, PA-C    Allergies Penicillins    Social History Social History  Substance Use Topics  . Smoking status: Never Smoker  . Smokeless tobacco: Current User  . Alcohol use Yes    Review of Systems Patient denies headaches, rhinorrhea, blurry vision, numbness, shortness of breath, chest pain, edema, cough, abdominal pain, nausea, vomiting, diarrhea, dysuria, fevers, rashes or hallucinations unless otherwise stated above in HPI. ____________________________________________   PHYSICAL EXAM:  VITAL SIGNS: Vitals:   08/02/16 1622  BP: (!) 174/100  Pulse: 63  Resp: 18  Temp: 98 F (36.7 C)    Constitutional: Alert and oriented. Well appearing and in no acute distress. Eyes: Conjunctivae are normal. PERRL. EOMI.  No proptosis Head: Atraumatic.  Right sided maxillary and frontal sinus ttp Nose: No congestion/rhinnorhea. Mouth/Throat: Mucous membranes are moist.  Oropharynx non-erythematous. Neck: No stridor. Painless ROM. No cervical spine tenderness to palpation Hematological/Lymphatic/Immunilogical: No cervical lymphadenopathy. Cardiovascular: Normal rate, regular rhythm. Grossly normal heart sounds.  Good peripheral circulation. Respiratory: Normal respiratory effort.  No retractions. Lungs CTAB. Gastrointestinal: Soft and nontender. No distention. No abdominal bruits. No CVA tenderness. Genitourinary:  Musculoskeletal: No  lower extremity tenderness nor edema.  No joint effusions. Neurologic:  CN- intact.  No facial droop, Normal FNF.  Normal heel to shin.  Sensation intact bilaterally. Normal speech and language. No gross focal  neurologic deficits are appreciated. No gait instability.  Skin:  Skin is warm, dry and intact. No rash noted. Psychiatric: Mood and affect are normal. Speech and behavior are normal.  ____________________________________________   LABS (all labs ordered are listed, but only abnormal results are displayed)  No results found for this or any previous visit (from the past 24 hour(s)). ____________________________________________  EKG____________________________________________  RADIOLOGY   ____________________________________________   PROCEDURES  Procedure(s) performed: none Procedures    Critical Care performed: no ____________________________________________   INITIAL IMPRESSION / ASSESSMENT AND PLAN / ED COURSE  Pertinent labs & imaging results that were available during my care of the patient were reviewed by me and considered in my medical decision making (see chart for details).  DDX: migraine, tension, sinusitis, abscess, mass, sah, iph,   Tommy Gomez is a 35 y.o. who presents to the ED p/w HA for last 14 days. Not worst HA ever. Gradual onset,  Comes and goes.  Right sided with radiation to right  Denies focal neurologic symptoms. Denies trauma. No fevers or neck pain. No vision loss. Afebrile in ED. VSS. Exam as above. No meningeal signs. No CN, motor, sensory or cerebellar deficits. Temporal arteries palpable and non-tender. Appears well and non-toxic.  Likely tension, non-specific or possible migraine HA. Given frontal tenderness to palpation and duration and he suspect a component of acute bacterial sinusitis. I did recommend we order CT imaging of the head to evaluate for any calm. Sinusitis such as abscess or extension through the cranium. Patient has declined this up referring to have a trial of outpatient management. Clinical picture is not consistent with ICH, SAH, SDH, EDH, TIA, or CVA. No concern for meningitis or encephalitis. No concern for GCA/Temporal  arteritis.  neuro exam is again without focal deficit, nuchal rigidity or evidence of meningeal irritation.  Stable to D/C home, follow up with PCP or Neurology if persistent recurrent Has.  Have discussed with the patient and available family all diagnostics and treatments performed thus far and all questions were answered to the best of my ability. The patient demonstrates understanding and agreement with plan.    Clinical Course      ____________________________________________   FINAL CLINICAL IMPRESSION(S) / ED DIAGNOSES  Final diagnoses:  Chronic nonintractable headache, unspecified headache type      NEW MEDICATIONS STARTED DURING THIS VISIT:  New Prescriptions   No medications on file     Note:  This document was prepared using Dragon voice recognition software and may include unintentional dictation errors.    Merlyn Lot, MD 08/02/16 [phone]

## 2016-08-02 NOTE — ED Notes (Signed)
Headache behind his rt eye - has been taking excedrin, aleve, mucinex x 2 weeks with no relief. bp is elevated today. States used to take hctz but states his bp got better and stopped taking.

## 2016-08-02 NOTE — ED Triage Notes (Signed)
States headache on and off x 2 weeks - no history of migraines.

## 2016-08-02 NOTE — Discharge Instructions (Signed)

## 2016-08-24 ENCOUNTER — Emergency Department: Payer: Self-pay

## 2016-08-24 ENCOUNTER — Emergency Department
Admission: EM | Admit: 2016-08-24 | Discharge: 2016-08-24 | Disposition: A | Discharge status: Home / Self Care | Payer: Self-pay | Attending: Emergency Medicine | Admitting: Emergency Medicine

## 2016-08-24 DIAGNOSIS — B9689 Other specified bacterial agents as the cause of diseases classified elsewhere: Secondary | ICD-10-CM

## 2016-08-24 DIAGNOSIS — J180 Bronchopneumonia, unspecified organism: Secondary | ICD-10-CM | POA: Insufficient documentation

## 2016-08-24 DIAGNOSIS — J329 Chronic sinusitis, unspecified: Secondary | ICD-10-CM | POA: Insufficient documentation

## 2016-08-24 DIAGNOSIS — F17200 Nicotine dependence, unspecified, uncomplicated: Secondary | ICD-10-CM | POA: Insufficient documentation

## 2016-08-24 DIAGNOSIS — I10 Essential (primary) hypertension: Secondary | ICD-10-CM | POA: Insufficient documentation

## 2016-08-24 DIAGNOSIS — Z79899 Other long term (current) drug therapy: Secondary | ICD-10-CM | POA: Insufficient documentation

## 2016-08-24 DIAGNOSIS — J019 Acute sinusitis, unspecified: Secondary | ICD-10-CM

## 2016-08-24 MED ORDER — AMOXICILLIN-POT CLAVULANATE 875-125 MG PO TABS: 1.0000 | ORAL_TABLET | Freq: Two times a day (BID) | ORAL | 0 refills | Status: DC

## 2016-08-24 MED ORDER — CEFTRIAXONE SODIUM 1 G IJ SOLR
1.0000 g | Freq: Once | INTRAMUSCULAR | Status: AC
  Administered 2016-08-24: 1 g via INTRAMUSCULAR
  Filled 2016-08-24: qty 10

## 2016-08-24 MED ORDER — PREDNISONE 50 MG PO TABS: 50.0000 mg | ORAL_TABLET | Freq: Every day | ORAL | 0 refills | Status: DC

## 2016-08-24 MED ORDER — AZITHROMYCIN 250 MG PO TABS: ORAL_TABLET | ORAL | 0 refills | Status: DC

## 2016-08-24 MED ORDER — FLUTICASONE PROPIONATE 50 MCG/ACT NA SUSP: 1.0000 | Freq: Two times a day (BID) | NASAL | 0 refills | Status: DC

## 2016-08-24 MED ORDER — ALBUTEROL SULFATE HFA 108 (90 BASE) MCG/ACT IN AERS: 2.0000 | INHALATION_SPRAY | RESPIRATORY_TRACT | 0 refills | Status: DC | PRN

## 2016-08-24 MED ORDER — CETIRIZINE HCL 10 MG PO TABS: 10.0000 mg | ORAL_TABLET | Freq: Every day | ORAL | 0 refills | Status: DC

## 2016-08-24 NOTE — ED Notes (Signed)

## 2016-08-24 NOTE — ED Triage Notes (Addendum)
Pt reports that he is having left side/back pain with congested cough (productive cough/yellow mucus) - reports foul odor to mucus and fould taste in throat - c/o rib pain and soreness - pain increases with inspiration - pt has been sick for 3 weeks

## 2016-08-24 NOTE — ED Notes (Signed)
Pt reports that he is having left side/back pain with congested cough (productive cough/yellow mucus) - reports foul odor to mucus and fould taste in throat - c/o rib pain and soreness - pain increases with inspiration - pt has been sick for 3 weeks

## 2016-08-24 NOTE — ED Provider Notes (Signed)
[facility] Emergency Department Provider Note  ____________________________________________  Time seen: Approximately 8:18 PM  I have reviewed the triage vital signs and the nursing notes.   HISTORY  Chief Complaint Cough    HPI Tommy Gomez is a 35 y.o. male who presents emergency department complaining of sinus congestion, sinus pressure, coughing, left rib pain. Patient states that symptoms have been ongoing times several weeks. Patient reports that he had a history of sinus infection a few months ago. He received antibiotics for same and symptoms resolved. Patient reports that sinus congestion and pressure returned approximately 3 weeks ago. He also developed a cough that is worsen. Patient reports intermittent subjective, tactile fevers with sweating. Patient reports that cough is productive. He denies any difficulty breathing, chest pain, dull pain, nausea or vomiting. Patient has not take any medications for this complaint prior to arrival.   Past Medical History:  Diagnosis Date  . Crohn disease ([place])   . Hypertension     There are no active problems to display for this patient.   History reviewed. No pertinent surgical history.  Prior to Admission medications   Medication Sig Start Date End Date Taking? Authorizing Provider  albuterol (PROVENTIL HFA;VENTOLIN HFA) 108 (90 Base) MCG/ACT inhaler Inhale 2 puffs into the lungs every 4 (four) hours as needed for wheezing or shortness of breath. 08/24/16   Charline Bills Merrell Rettinger, PA-C  amoxicillin-clavulanate (AUGMENTIN) 875-125 MG tablet Take 1 tablet by mouth 2 (two) times daily. 08/24/16   Charline Bills Ahnya Akre, PA-C  azithromycin (ZITHROMAX Z-PAK) 250 MG tablet Take 2 tablets (500 mg) on  Day 1,  followed by 1 tablet (250 mg) once daily on Days 2 through 5. 08/24/16   Roderic Palau D Lamiya Naas, PA-C  butalbital-acetaminophen-caffeine (FIORICET, ESGIC) 50-325-40 MG tablet Take 1-2 tablets by mouth every 6  (six) hours as needed for headache. 08/02/16 08/02/17  Merlyn Lot, MD  cetirizine (ZYRTEC) 10 MG tablet Take 1 tablet (10 mg total) by mouth daily. 08/24/16   Charline Bills Janey Petron, PA-C  dicyclomine (BENTYL) 20 MG tablet Take 1 tablet (20 mg total) by mouth 3 (three) times daily as needed (abdominal pain). 10/08/15 10/07/16  Nance Pear, MD  famotidine (PEPCID) 20 MG tablet Take 1 tablet (20 mg total) by mouth daily. 04/26/16 05/26/16  Jami L Hagler, PA-C  fluticasone (FLONASE) 50 MCG/ACT nasal spray Place 1 spray into both nostrils 2 (two) times daily. 08/24/16   Charline Bills Cyndel Griffey, PA-C  ondansetron (ZOFRAN) 4 MG tablet Take 1 tablet (4 mg total) by mouth every 8 (eight) hours as needed. 10/08/15   Nance Pear, MD  oxyCODONE-acetaminophen (ROXICET) 5-325 MG per tablet Take 1 tablet by mouth every 4 (four) hours as needed for severe pain. 04/05/15   Nance Pear, MD  predniSONE (DELTASONE) 50 MG tablet Take 1 tablet (50 mg total) by mouth daily with breakfast. 08/24/16   Charline Bills Asusena Sigley, PA-C  sulfamethoxazole-trimethoprim (BACTRIM DS,SEPTRA DS) 800-160 MG tablet Take 1 tablet by mouth 2 (two) times daily. 11/23/15   Duanne Guess, PA-C    Allergies Patient has no active allergies.  No family history on file.  Social History Social History  Substance Use Topics  . Smoking status: Never Smoker  . Smokeless tobacco: Current User  . Alcohol use No     Review of Systems  Constitutional: No fever/chills Eyes: No visual changes. No discharge ENT: Positive for nasal congestion with sinus pressure. Cardiovascular: no chest pain. Respiratory: Positive for productive. No SOB.  Gastrointestinal: No abdominal pain.  No nausea, no vomiting.  Musculoskeletal: Positive for left-sided rib pain Skin: Negative for rash, abrasions, lacerations, ecchymosis. Neurological: Negative for headaches, focal weakness or numbness. 10-point ROS otherwise  negative.  ____________________________________________   PHYSICAL EXAM:  VITAL SIGNS: ED Triage Vitals [08/24/16 1859]  Enc Vitals Group     BP      Pulse      Resp      Temp      Temp src      SpO2      Weight 240 lb (108.9 kg)     Height 6' 1"  (1.854 m)     Head Circumference      Peak Flow      Pain Score 6     Pain Loc      Pain Edu?      Excl. in Point?      Constitutional: Alert and oriented. Well appearing and in no acute distress. Eyes: Conjunctivae are normal. PERRL. EOMI. Head: Atraumatic. ENT:      Ears:       Nose: Moderate purulent congestion/rhinnorhea. Patient is tender to percussion over the frontal and maxillary sinuses.      Mouth/Throat: Mucous membranes are moist. Oropharynx is mildly erythematous but nonedematous. Uvula is midline. Tonsils are mildly erythematous but nonedematous and no exudates are present. Neck: No stridor. Neck is supple full range of motion Hematological/Lymphatic/Immunilogical: Diffuse, mobile, nontender anterior cervical lymphadenopathy. Cardiovascular: Normal rate, regular rhythm. Normal S1 and S2.  Good peripheral circulation. Respiratory: Normal respiratory effort without tachypnea or retractions. Lungs expiratory wheezing to left lower lung field. No rales or rhonchi. Good air entry to the bases with no decreased or absent breath sounds. Musculoskeletal: Full range of motion to all extremities. No gross deformities appreciated. Neurologic:  Normal speech and language. No gross focal neurologic deficits are appreciated.  Skin:  Skin is warm, dry and intact. No rash noted. Psychiatric: Mood and affect are normal. Speech and behavior are normal. Patient exhibits appropriate insight and judgement.   ____________________________________________   LABS (all labs ordered are listed, but only abnormal results are displayed)  Labs Reviewed - No data to  display ____________________________________________  EKG   ____________________________________________  RADIOLOGY Diamantina Providence Yarielis Funaro, personally viewed and evaluated these images (plain radiographs) as part of my medical decision making, as well as reviewing the written report by the radiologist.  Dg Chest 2 View  Result Date: 08/24/2016 CLINICAL DATA:  Pt reports that he is having left side/back pain with congested cough (productive cough/yellow mucus) - reports foul odor to mucus and fould taste in throat - c/o rib pain and soreness - pain increases with inspiration - pt has been sick for 3 weeks EXAM: CHEST  2 VIEW COMPARISON:  12/02/2013 FINDINGS: There are small ill-defined nodular opacities in the left lower lobe associated with irregular thickening of the bronchovascular structures. This suggest bronchopneumonia. Remainder of the lungs is clear. No pleural effusion. No pneumothorax. The heart, mediastinum and hila are unremarkable. Skeletal structures are unremarkable. IMPRESSION: Findings consistent with left lower lobe bronchopneumonia. Electronically Signed   By: Lajean Manes M.D.   On: 08/24/2016 19:49    ____________________________________________    PROCEDURES  Procedure(s) performed:    Procedures    Medications  cefTRIAXone (ROCEPHIN) injection 1 g (not administered)     ____________________________________________   INITIAL IMPRESSION / ASSESSMENT AND PLAN / ED COURSE  Pertinent labs & imaging results that were available during my care  of the patient were reviewed by me and considered in my medical decision making (see chart for details).  Review of the [place] CSRS was performed in accordance of the [place] prior to dispensing any controlled drugs.  Clinical Course     Patient's diagnosis is consistent with Acute bacterial sinusitis and left lower bronchopneumonia. X-ray reveals proximal pneumonia to left lower lobe. Exam reveals acute bacterial  sinusitis. Patient is given shot of Rocephin in the emergency department he'll be discharged home with oral antibiotics, Flonase, Zyrtec, prednisone, albuterol inhaler..Patient has had reoccurring bacterial sinusitis. The patient's symptoms persist, he'll follow up with ENT provider. Patient is given ED precautions to return to the ED for any worsening or new symptoms.     ____________________________________________  FINAL CLINICAL IMPRESSION(S) / ED DIAGNOSES  Final diagnoses:  Acute bacterial sinusitis  Bronchopneumonia      NEW MEDICATIONS STARTED DURING THIS VISIT:  New Prescriptions   ALBUTEROL (PROVENTIL HFA;VENTOLIN HFA) 108 (90 BASE) MCG/ACT INHALER    Inhale 2 puffs into the lungs every 4 (four) hours as needed for wheezing or shortness of breath.   AMOXICILLIN-CLAVULANATE (AUGMENTIN) 875-125 MG TABLET    Take 1 tablet by mouth 2 (two) times daily.   AZITHROMYCIN (ZITHROMAX Z-PAK) 250 MG TABLET    Take 2 tablets (500 mg) on  Day 1,  followed by 1 tablet (250 mg) once daily on Days 2 through 5.   CETIRIZINE (ZYRTEC) 10 MG TABLET    Take 1 tablet (10 mg total) by mouth daily.   FLUTICASONE (FLONASE) 50 MCG/ACT NASAL SPRAY    Place 1 spray into both nostrils 2 (two) times daily.   PREDNISONE (DELTASONE) 50 MG TABLET    Take 1 tablet (50 mg total) by mouth daily with breakfast.        This chart was dictated using voice recognition software/Dragon. Despite best efforts to proofread, errors can occur which can change the meaning. Any change was purely unintentional.    Darletta Moll, PA-C 08/24/16 2035    Eula Listen, MD 08/24/16 2206

## 2016-09-22 ENCOUNTER — Emergency Department
Admission: EM | Admit: 2016-09-22 | Discharge: 2016-09-22 | Disposition: A | Discharge status: Home / Self Care | Payer: Self-pay | Attending: Emergency Medicine | Admitting: Emergency Medicine

## 2016-09-22 DIAGNOSIS — R109 Unspecified abdominal pain: Secondary | ICD-10-CM | POA: Insufficient documentation

## 2016-09-22 DIAGNOSIS — Z79899 Other long term (current) drug therapy: Secondary | ICD-10-CM | POA: Insufficient documentation

## 2016-09-22 DIAGNOSIS — R110 Nausea: Secondary | ICD-10-CM | POA: Insufficient documentation

## 2016-09-22 DIAGNOSIS — Z5321 Procedure and treatment not carried out due to patient leaving prior to being seen by health care provider: Secondary | ICD-10-CM | POA: Insufficient documentation

## 2016-09-22 DIAGNOSIS — F17290 Nicotine dependence, other tobacco product, uncomplicated: Secondary | ICD-10-CM | POA: Insufficient documentation

## 2016-09-22 DIAGNOSIS — I10 Essential (primary) hypertension: Secondary | ICD-10-CM | POA: Insufficient documentation

## 2016-09-22 LAB — LIPASE, BLOOD: Lipase: 41 U/L (ref 11–51)

## 2016-09-22 LAB — CBC
HEMATOCRIT: 46.5 % (ref 40.0–52.0)
Hemoglobin: 16.4 g/dL (ref 13.0–18.0)
MCH: 31.6 pg (ref 26.0–34.0)
MCHC: 35.3 g/dL (ref 32.0–36.0)
MCV: 89.7 fL (ref 80.0–100.0)
Platelets: 172 10*3/uL (ref 150–440)
RBC: 5.19 MIL/uL (ref 4.40–5.90)
RDW: 13.8 % (ref 11.5–14.5)
WBC: 4.3 10*3/uL (ref 3.8–10.6)

## 2016-09-22 LAB — COMPREHENSIVE METABOLIC PANEL
ALBUMIN: 3.8 g/dL (ref 3.5–5.0)
ALT: 174 U/L — AB (ref 17–63)
AST: 306 U/L — AB (ref 15–41)
Alkaline Phosphatase: 110 U/L (ref 38–126)
Anion gap: 9 (ref 5–15)
BILIRUBIN TOTAL: 1.5 mg/dL — AB (ref 0.3–1.2)
BUN: 15 mg/dL (ref 6–20)
CO2: 28 mmol/L (ref 22–32)
Calcium: 8.4 mg/dL — ABNORMAL LOW (ref 8.9–10.3)
Chloride: 100 mmol/L — ABNORMAL LOW (ref 101–111)
Creatinine, Ser: 1.1 mg/dL (ref 0.61–1.24)
GFR calc Af Amer: >60 mL/min (ref >60)
GFR calc non Af Amer: >60 mL/min (ref >60)
GLUCOSE: 128 mg/dL — AB (ref 65–99)
POTASSIUM: 4 mmol/L (ref 3.5–5.1)
Sodium: 137 mmol/L (ref 135–145)
TOTAL PROTEIN: 7.6 g/dL (ref 6.5–8.1)

## 2016-09-22 MED ORDER — ONDANSETRON 4 MG PO TBDP
ORAL_TABLET | ORAL | Status: AC
  Administered 2016-09-22: 4 mg via ORAL
  Filled 2016-09-22: qty 1

## 2016-09-22 MED ORDER — ONDANSETRON 4 MG PO TBDP
4.0000 mg | ORAL_TABLET | Freq: Once | ORAL | Status: AC | PRN
  Administered 2016-09-22: 4 mg via ORAL

## 2016-09-22 NOTE — ED Notes (Signed)
Pt came to desk asking when he was to be seen, states he is leaving and going to [place], pt informed of pt acuity and wait times

## 2016-09-22 NOTE — ED Notes (Signed)
No answer when called from lobby; noted notation that pt reported leaving at 1816

## 2016-09-22 NOTE — ED Triage Notes (Signed)
Pt arrives to ER via POV c/o nausea and mild abdominal pain. Pt hx of chron's. Recurrent nausea and abdominal pain. Pt alert and oriented X4, active, cooperative, pt in NAD. RR even and unlabored, color WNL.

## 2016-09-28 ENCOUNTER — Emergency Department: Payer: BLUE CROSS/BLUE SHIELD

## 2016-09-28 ENCOUNTER — Encounter: Payer: Self-pay | Admitting: Intensive Care

## 2016-09-28 ENCOUNTER — Inpatient Hospital Stay
Admission: EM | Admit: 2016-09-28 | Discharge: 2016-10-01 | DRG: 439 | Disposition: A | Discharge status: Home / Self Care | Payer: BLUE CROSS/BLUE SHIELD | Attending: Internal Medicine | Admitting: Internal Medicine

## 2016-09-28 DIAGNOSIS — Z79891 Long term (current) use of opiate analgesic: Secondary | ICD-10-CM

## 2016-09-28 DIAGNOSIS — G8929 Other chronic pain: Secondary | ICD-10-CM | POA: Diagnosis present

## 2016-09-28 DIAGNOSIS — K859 Acute pancreatitis without necrosis or infection, unspecified: Secondary | ICD-10-CM | POA: Diagnosis present

## 2016-09-28 DIAGNOSIS — F10239 Alcohol dependence with withdrawal, unspecified: Secondary | ICD-10-CM | POA: Diagnosis present

## 2016-09-28 DIAGNOSIS — K760 Fatty (change of) liver, not elsewhere classified: Secondary | ICD-10-CM | POA: Diagnosis present

## 2016-09-28 DIAGNOSIS — K852 Alcohol induced acute pancreatitis without necrosis or infection: Secondary | ICD-10-CM | POA: Diagnosis present

## 2016-09-28 DIAGNOSIS — Z8249 Family history of ischemic heart disease and other diseases of the circulatory system: Secondary | ICD-10-CM

## 2016-09-28 DIAGNOSIS — Z6833 Body mass index (BMI) 33.0-33.9, adult: Secondary | ICD-10-CM

## 2016-09-28 DIAGNOSIS — E876 Hypokalemia: Secondary | ICD-10-CM | POA: Diagnosis present

## 2016-09-28 DIAGNOSIS — K5090 Crohn's disease, unspecified, without complications: Secondary | ICD-10-CM | POA: Diagnosis present

## 2016-09-28 DIAGNOSIS — E871 Hypo-osmolality and hyponatremia: Secondary | ICD-10-CM | POA: Diagnosis present

## 2016-09-28 DIAGNOSIS — E860 Dehydration: Secondary | ICD-10-CM | POA: Diagnosis present

## 2016-09-28 DIAGNOSIS — I10 Essential (primary) hypertension: Secondary | ICD-10-CM | POA: Diagnosis present

## 2016-09-28 DIAGNOSIS — Z7951 Long term (current) use of inhaled steroids: Secondary | ICD-10-CM

## 2016-09-28 DIAGNOSIS — Z23 Encounter for immunization: Secondary | ICD-10-CM

## 2016-09-28 DIAGNOSIS — Z7952 Long term (current) use of systemic steroids: Secondary | ICD-10-CM

## 2016-09-28 LAB — COMPREHENSIVE METABOLIC PANEL
ALT: 134 U/L — AB (ref 17–63)
ALT: 110 U/L — AB (ref 17–63)
AST: 158 U/L — AB (ref 15–41)
AST: 156 U/L — AB (ref 15–41)
Albumin: 3.9 g/dL (ref 3.5–5.0)
Albumin: 3.3 g/dL — ABNORMAL LOW (ref 3.5–5.0)
Alkaline Phosphatase: 147 U/L — ABNORMAL HIGH (ref 38–126)
Alkaline Phosphatase: 114 U/L (ref 38–126)
Anion gap: 17 — ABNORMAL HIGH (ref 5–15)
Anion gap: 12 (ref 5–15)
BILIRUBIN TOTAL: 2.3 mg/dL — AB (ref 0.3–1.2)
BILIRUBIN TOTAL: 2.5 mg/dL — AB (ref 0.3–1.2)
BUN: 18 mg/dL (ref 6–20)
BUN: 16 mg/dL (ref 6–20)
CHLORIDE: 103 mmol/L (ref 101–111)
CO2: 21 mmol/L — ABNORMAL LOW (ref 22–32)
CO2: 21 mmol/L — ABNORMAL LOW (ref 22–32)
CREATININE: 1.17 mg/dL (ref 0.61–1.24)
CREATININE: 1.28 mg/dL — AB (ref 0.61–1.24)
Calcium: 8.9 mg/dL (ref 8.9–10.3)
Calcium: 7.8 mg/dL — ABNORMAL LOW (ref 8.9–10.3)
Chloride: 94 mmol/L — ABNORMAL LOW (ref 101–111)
GFR calc Af Amer: >60 mL/min (ref >60)
Glucose, Bld: 106 mg/dL — ABNORMAL HIGH (ref 65–99)
Glucose, Bld: 104 mg/dL — ABNORMAL HIGH (ref 65–99)
POTASSIUM: 3.1 mmol/L — AB (ref 3.5–5.1)
Potassium: 3.2 mmol/L — ABNORMAL LOW (ref 3.5–5.1)
Sodium: 132 mmol/L — ABNORMAL LOW (ref 135–145)
Sodium: 136 mmol/L (ref 135–145)
TOTAL PROTEIN: 7.9 g/dL (ref 6.5–8.1)
TOTAL PROTEIN: 6.8 g/dL (ref 6.5–8.1)

## 2016-09-28 LAB — CBC
HCT: 51.6 % (ref 40.0–52.0)
Hemoglobin: 18.6 g/dL — ABNORMAL HIGH (ref 13.0–18.0)
MCH: 31.8 pg (ref 26.0–34.0)
MCHC: 36.1 g/dL — ABNORMAL HIGH (ref 32.0–36.0)
MCV: 88.1 fL (ref 80.0–100.0)
PLATELETS: 185 10*3/uL (ref 150–440)
RBC: 5.86 MIL/uL (ref 4.40–5.90)
RDW: 13.9 % (ref 11.5–14.5)
WBC: 6.9 10*3/uL (ref 3.8–10.6)

## 2016-09-28 LAB — LIPASE, BLOOD: Lipase: 349 U/L — ABNORMAL HIGH (ref 11–51)

## 2016-09-28 LAB — INFLUENZA PANEL BY PCR (TYPE A & B)
INFLBPCR: NEGATIVE
Influenza A By PCR: NEGATIVE

## 2016-09-28 MED ORDER — ENOXAPARIN SODIUM 40 MG/0.4ML ~~LOC~~ SOLN
40.0000 mg | SUBCUTANEOUS | Status: DC
  Administered 2016-09-28 – 2016-09-30 (×3): 40 mg via SUBCUTANEOUS
  Filled 2016-09-28 (×3): qty 0.4

## 2016-09-28 MED ORDER — SENNOSIDES-DOCUSATE SODIUM 8.6-50 MG PO TABS: 1.0000 | ORAL_TABLET | Freq: Every evening | ORAL | Status: DC | PRN

## 2016-09-28 MED ORDER — POTASSIUM CHLORIDE IN NACL 40-0.9 MEQ/L-% IV SOLN
INTRAVENOUS | Status: DC
  Administered 2016-09-28 – 2016-09-30 (×4): 100 mL/h via INTRAVENOUS
  Filled 2016-09-28 (×7): qty 1000

## 2016-09-28 MED ORDER — LORAZEPAM 1 MG PO TABS: 1.0000 mg | ORAL_TABLET | Freq: Four times a day (QID) | ORAL | Status: DC | PRN

## 2016-09-28 MED ORDER — IOPAMIDOL (ISOVUE-300) INJECTION 61%
100.0000 mL | Freq: Once | INTRAVENOUS | Status: AC | PRN
  Administered 2016-09-28: 100 mL via INTRAVENOUS

## 2016-09-28 MED ORDER — HYDROMORPHONE HCL 1 MG/ML IJ SOLN
1.0000 mg | Freq: Once | INTRAMUSCULAR | Status: AC
  Administered 2016-09-28: 1 mg via INTRAVENOUS
  Filled 2016-09-28: qty 1

## 2016-09-28 MED ORDER — BISACODYL 5 MG PO TBEC: 5.0000 mg | DELAYED_RELEASE_TABLET | Freq: Every day | ORAL | Status: DC | PRN

## 2016-09-28 MED ORDER — ACETAMINOPHEN 650 MG RE SUPP
650.0000 mg | Freq: Four times a day (QID) | RECTAL | Status: DC | PRN
Start: 2016-09-28 — End: 2016-10-01

## 2016-09-28 MED ORDER — VITAMIN B-1 100 MG PO TABS
100.0000 mg | ORAL_TABLET | Freq: Every day | ORAL | Status: DC
  Administered 2016-09-29 – 2016-10-01 (×3): 100 mg via ORAL
  Filled 2016-09-28 (×4): qty 1

## 2016-09-28 MED ORDER — ONDANSETRON HCL 4 MG/2ML IJ SOLN
4.0000 mg | Freq: Four times a day (QID) | INTRAMUSCULAR | Status: DC | PRN
  Administered 2016-09-28 – 2016-09-30 (×4): 4 mg via INTRAVENOUS
  Filled 2016-09-28 (×3): qty 2

## 2016-09-28 MED ORDER — FOLIC ACID 1 MG PO TABS
1.0000 mg | ORAL_TABLET | Freq: Every day | ORAL | Status: DC
  Administered 2016-09-28 – 2016-10-01 (×4): 1 mg via ORAL
  Filled 2016-09-28 (×4): qty 1

## 2016-09-28 MED ORDER — ONDANSETRON HCL 4 MG/2ML IJ SOLN
INTRAMUSCULAR | Status: AC
  Administered 2016-09-28: 4 mg via INTRAVENOUS
  Filled 2016-09-28: qty 2

## 2016-09-28 MED ORDER — ADULT MULTIVITAMIN W/MINERALS CH
1.0000 | ORAL_TABLET | Freq: Every day | ORAL | Status: DC
  Administered 2016-09-28 – 2016-10-01 (×4): 1 via ORAL
  Filled 2016-09-28 (×4): qty 1

## 2016-09-28 MED ORDER — IOPAMIDOL (ISOVUE-300) INJECTION 61%
30.0000 mL | Freq: Once | INTRAVENOUS | Status: AC | PRN
  Administered 2016-09-28: 30 mL via ORAL

## 2016-09-28 MED ORDER — SODIUM CHLORIDE 0.9 % IV BOLUS (SEPSIS)
1000.0000 mL | Freq: Once | INTRAVENOUS | Status: AC
  Administered 2016-09-28: 1000 mL via INTRAVENOUS

## 2016-09-28 MED ORDER — ACETAMINOPHEN 325 MG PO TABS: 650.0000 mg | ORAL_TABLET | Freq: Four times a day (QID) | ORAL | Status: DC | PRN

## 2016-09-28 MED ORDER — ONDANSETRON HCL 4 MG PO TABS: 4.0000 mg | ORAL_TABLET | Freq: Four times a day (QID) | ORAL | Status: DC | PRN

## 2016-09-28 MED ORDER — LORAZEPAM 2 MG/ML IJ SOLN
INTRAMUSCULAR | Status: AC
  Administered 2016-09-28: 1 mg via INTRAVENOUS
  Filled 2016-09-28: qty 1

## 2016-09-28 MED ORDER — KETOROLAC TROMETHAMINE 30 MG/ML IJ SOLN
30.0000 mg | Freq: Four times a day (QID) | INTRAMUSCULAR | Status: DC | PRN
  Administered 2016-09-28 – 2016-09-30 (×5): 30 mg via INTRAVENOUS
  Filled 2016-09-28 (×5): qty 1

## 2016-09-28 MED ORDER — ONDANSETRON HCL 4 MG/2ML IJ SOLN
4.0000 mg | Freq: Once | INTRAMUSCULAR | Status: AC
  Administered 2016-09-28: 4 mg via INTRAVENOUS
  Filled 2016-09-28: qty 2

## 2016-09-28 MED ORDER — ALBUTEROL SULFATE (2.5 MG/3ML) 0.083% IN NEBU: 2.5000 mg | INHALATION_SOLUTION | RESPIRATORY_TRACT | Status: DC | PRN

## 2016-09-28 MED ORDER — HYDROCODONE-ACETAMINOPHEN 5-325 MG PO TABS: 1.0000 | ORAL_TABLET | ORAL | Status: DC | PRN

## 2016-09-28 MED ORDER — INFLUENZA VAC SPLIT QUAD 0.5 ML IM SUSY
0.5000 mL | PREFILLED_SYRINGE | INTRAMUSCULAR | Status: AC
  Administered 2016-09-30: 0.5 mL via INTRAMUSCULAR
  Filled 2016-09-28 (×2): qty 0.5

## 2016-09-28 MED ORDER — THIAMINE HCL 100 MG/ML IJ SOLN
100.0000 mg | Freq: Every day | INTRAMUSCULAR | Status: DC
  Administered 2016-09-28: 100 mg via INTRAVENOUS
  Filled 2016-09-28: qty 2

## 2016-09-28 MED ORDER — FAMOTIDINE 20 MG PO TABS
20.0000 mg | ORAL_TABLET | Freq: Every day | ORAL | Status: DC
  Administered 2016-09-29 – 2016-10-01 (×3): 20 mg via ORAL
  Filled 2016-09-28 (×3): qty 1

## 2016-09-28 MED ORDER — PNEUMOCOCCAL VAC POLYVALENT 25 MCG/0.5ML IJ INJ: 0.5000 mL | INJECTION | INTRAMUSCULAR | Status: DC

## 2016-09-28 MED ORDER — ONDANSETRON 4 MG PO TBDP
4.0000 mg | ORAL_TABLET | Freq: Once | ORAL | Status: AC | PRN
  Administered 2016-09-28: 4 mg via ORAL
  Filled 2016-09-28: qty 1

## 2016-09-28 MED ORDER — LORAZEPAM 2 MG/ML IJ SOLN
1.0000 mg | Freq: Four times a day (QID) | INTRAMUSCULAR | Status: DC | PRN
  Administered 2016-09-28 – 2016-09-30 (×6): 1 mg via INTRAVENOUS
  Filled 2016-09-28 (×5): qty 1

## 2016-09-28 NOTE — ED Notes (Signed)
Pt returned from CT via stretcher.

## 2016-09-28 NOTE — Progress Notes (Signed)
Left message at [facility] [phone]) in [place], [place] regarding methadone dose. Operator took a message and said he would have someone call me back with dose information.  Chetara Kropp A. [place], [place].D., BCPS Clinical Pharmacist 09/28/2016 2350

## 2016-09-28 NOTE — ED Notes (Signed)
Wife requested pt be moved to room. Pt moved to room 4.

## 2016-09-28 NOTE — ED Notes (Signed)
Pt given ice chips

## 2016-09-28 NOTE — ED Notes (Signed)
Marcie Bal EDT and this RN walked into pt room and he appeared like he was having a panic attack. PT denies having this feeling prior to right now. States he has shaken before but not this bad. Pt HR on pulse ox was 130, so Jann EDT placed pt on monitor for further eval. Pt appeared to start shaking more and repeating that he feels like his ears are under water and everyone is talking loud. Pt stating he can not get comfortable and is moving around in bed. HR slowly coming down, pt deep breathing to bring respirations back down. Pt continues to shake.

## 2016-09-28 NOTE — ED Notes (Signed)
Dr. Chen at bedside.

## 2016-09-28 NOTE — ED Notes (Signed)
Pt having panic attack, breathing fast, stated ears feel muffled like under water and everyone is talking loudly. Pt informed he needs to slow his breathing down.

## 2016-09-28 NOTE — ED Triage Notes (Signed)
PAtient reports for the past week he has not been able to keep food or liquid down. Pt c/o L sided flank pain. Was triaged here 6 days ago and LWB. A&O x4. Pt has emesis during triage

## 2016-09-28 NOTE — ED Notes (Signed)
Pt HR back down to 98-105, pt breathing slowed and pt talking more now. Dorthea Cove, RN 2C to see if she was ok with pt status, she stated he could come to [facility] at this time.

## 2016-09-28 NOTE — ED Notes (Signed)
Pt taken to CT via stretcher.

## 2016-09-28 NOTE — H&P (Signed)
[place] [facility] [place] NAME: Tommy Gomez    MR#:  [id number]  DATE OF BIRTH:  1980-09-17  DATE OF ADMISSION:  09/28/2016  PRIMARY CARE PHYSICIAN: No PCP Per Patient   REQUESTING/REFERRING PHYSICIAN: Harvest Dark, MD  CHIEF COMPLAINT:   Chief Complaint  Patient presents with  . Emesis  . Nausea   Abdominal pain, nausea, vomiting and diarrhea for 6 days. HISTORY OF PRESENT ILLNESS:  Tommy Gomez  is a 36 y.o. male with a known history of Crohn's disease and hypertension. The patient presents to the ED with diffuse abdominal pain, nausea, vomiting and diarrhea for 5-6 days. The abdominal pain is diffuse majorly in epigastric area, severe, sharp and constant, associated with nausea and vomiting and diarrhea. He denies any radiation to the back. He cannot tolerate any oral intake. In addition, he has tremors and anxiety. He regularly drink alcohol on daily basis, but hasn't drink alcohol for the past few days due to nausea, vomiting and abdominal pain. His lipase is elevated. CAT scan of abdomen showed Hepatic steatosis.  PAST MEDICAL HISTORY:   Past Medical History:  Diagnosis Date  . Crohn disease ([place])   . Hypertension     PAST SURGICAL HISTORY:   Past Surgical History:  Procedure Laterality Date  . FACIAL FRACTURE SURGERY    . FASCIOTOMY      SOCIAL HISTORY:   Social History  Substance Use Topics  . Smoking status: Never Smoker  . Smokeless tobacco: Current User  . Alcohol use Yes     Comment: Occ    FAMILY HISTORY:   Family History  Problem Relation Age of Onset  . Hypertension Father     DRUG ALLERGIES:  No Known Allergies  REVIEW OF SYSTEMS:   Review of Systems  Constitutional: Positive for malaise/fatigue. Negative for chills and fever.  HENT: Negative for congestion and sore throat.   Eyes: Negative for blurred vision and double vision.  Respiratory: Negative for cough, shortness of breath and  wheezing.   Cardiovascular: Negative for chest pain and leg swelling.  Gastrointestinal: Positive for abdominal pain, diarrhea, nausea and vomiting. Negative for blood in stool and melena.  Genitourinary: Negative for dysuria, hematuria and urgency.  Musculoskeletal: Negative for joint pain.  Skin: Negative for itching and rash.  Neurological: Positive for weakness. Negative for dizziness, focal weakness and loss of consciousness.  Psychiatric/Behavioral: Positive for depression. The patient is nervous/anxious.     MEDICATIONS AT HOME:   Prior to Admission medications   Medication Sig Start Date End Date Taking? Authorizing Provider  famotidine (PEPCID) 20 MG tablet Take 1 tablet (20 mg total) by mouth daily. 04/26/16 09/28/17 Yes Jami L Hagler, PA-C  ibuprofen (ADVIL,MOTRIN) 200 MG tablet Take 200 mg by mouth every 6 (six) hours as needed.   Yes Historical Provider, MD  ondansetron (ZOFRAN) 4 MG tablet Take 1 tablet (4 mg total) by mouth every 8 (eight) hours as needed. 10/08/15  Yes Nance Pear, MD  albuterol (PROVENTIL HFA;VENTOLIN HFA) 108 (90 Base) MCG/ACT inhaler Inhale 2 puffs into the lungs every 4 (four) hours as needed for wheezing or shortness of breath. Patient not taking: Reported on 09/28/2016 08/24/16   Charline Bills Cuthriell, PA-C  butalbital-acetaminophen-caffeine (FIORICET, ESGIC) [phone] MG tablet Take 1-2 tablets by mouth every 6 (six) hours as needed for headache. Patient not taking: Reported on 09/28/2016 08/02/16 08/02/17  Merlyn Lot, MD  cetirizine (ZYRTEC) 10 MG tablet Take 1 tablet (10  mg total) by mouth daily. Patient not taking: Reported on 09/28/2016 08/24/16   Charline Bills Cuthriell, PA-C  dicyclomine (BENTYL) 20 MG tablet Take 1 tablet (20 mg total) by mouth 3 (three) times daily as needed (abdominal pain). Patient not taking: Reported on 09/28/2016 10/08/15 10/07/16  Nance Pear, MD  fluticasone [employer]) 50 MCG/ACT nasal spray Place 1 spray into both  nostrils 2 (two) times daily. Patient not taking: Reported on 09/28/2016 08/24/16   Charline Bills Cuthriell, PA-C  oxyCODONE-acetaminophen (ROXICET) 5-325 MG per tablet Take 1 tablet by mouth every 4 (four) hours as needed for severe pain. Patient not taking: Reported on 09/28/2016 04/05/15   Nance Pear, MD  predniSONE (DELTASONE) 50 MG tablet Take 1 tablet (50 mg total) by mouth daily with breakfast. Patient not taking: Reported on 09/28/2016 08/24/16   Charline Bills Cuthriell, PA-C      VITAL SIGNS:  Blood pressure 135/76, pulse (!) 140, temperature 97.7 F (36.5 C), temperature source Oral, resp. rate 18, height 6' 1"  (1.854 m), weight 240 lb (108.9 kg), SpO2 100 %.  PHYSICAL EXAMINATION:  Physical Exam  GENERAL:  36 y.o.-year-old patient lying in the bed with no acute distress. Morbidly obese. EYES: Pupils equal, round, reactive to light and accommodation. No scleral icterus. Extraocular muscles intact.  HEENT: Head atraumatic, normocephalic. Oropharynx and nasopharynx clear.  NECK:  Supple, no jugular venous distention. No thyroid enlargement, no tenderness.  LUNGS: Normal breath sounds bilaterally, no wheezing, rales,rhonchi or crepitation. No use of accessory muscles of respiration.  CARDIOVASCULAR: S1, S2 normal. No murmurs, rubs, or gallops.  ABDOMEN: Soft,  Tenderness in epigastric area, nondistended. Bowel sounds present. No organomegaly or mass.  EXTREMITIES: No pedal edema, cyanosis, or clubbing.  NEUROLOGIC: Cranial nerves II through XII are intact. Muscle strength 5/5 in all extremities. Sensation intact. Gait not checked. No tremors. PSYCHIATRIC: The patient is alert and oriented x 3. Anxious. SKIN: No obvious rash, lesion, or ulcer.   LABORATORY PANEL:   CBC  Recent Labs Lab 09/28/16 1243  WBC 6.9  HGB 18.6*  HCT 51.6  PLT 185   ------------------------------------------------------------------------------------------------------------------  Chemistries    Recent Labs Lab 09/28/16 1243  NA 132*  K 3.2*  CL 94*  CO2 21*  GLUCOSE 106*  BUN 18  CREATININE 1.17  CALCIUM 8.9  AST 158*  ALT 134*  ALKPHOS 147*  BILITOT 2.3*   ------------------------------------------------------------------------------------------------------------------  Cardiac Enzymes No results for input(s): TROPONINI in the last 168 hours. ------------------------------------------------------------------------------------------------------------------  RADIOLOGY:  Ct Abdomen Pelvis W Contrast  Result Date: 09/28/2016 CLINICAL DATA:  LEFT-sided flank pain EXAM: CT ABDOMEN AND PELVIS WITH CONTRAST TECHNIQUE: Multidetector CT imaging of the abdomen and pelvis was performed using the standard protocol following bolus administration of intravenous contrast. CONTRAST:  53m ISOVUE-300 IOPAMIDOL (ISOVUE-300) INJECTION 61%, 108mISOVUE-300 IOPAMIDOL (ISOVUE-300) INJECTION 61% COMPARISON:  CT 10/08/2015 FINDINGS: Lower chest: Lung bases are clear. Hepatobiliary: Diffuse low-attenuation liver. Normal gallbladder. No duct dilatation. Pancreas: Pancreas is normal. No ductal dilatation. No pancreatic inflammation. Spleen: Normal spleen Adrenals/urinary tract: Adrenal glands and kidneys are normal. The ureters and bladder normal. Stomach/Bowel: Stomach, small bowel, appendix, and cecum are normal. The colon and rectosigmoid colon are normal. Vascular/Lymphatic: Abdominal aorta is normal caliber. There is no retroperitoneal or periportal lymphadenopathy. No pelvic lymphadenopathy. Reproductive: Number prostate gland. Other: No free fluid. Musculoskeletal: No aggressive osseous lesion. IMPRESSION: 1. Hepatic steatosis. 2. No acute findings in the abdomen pelvis. Electronically Signed   By: StSuzy Bouchard.D.   On:  09/28/2016 18:07   US Abdomen Limited Ruq  Result Date: 09/28/2016 CLINICAL DATA:  Elevated LFTs. EXAM: US ABDOMEN LIMITED - RIGHT UPPER QUADRANT COMPARISON:  04/10/2009  FINDINGS: Gallbladder: No gallstones or wall thickening visualized. No sonographic Murphy sign noted by sonographer. Common bile duct: Diameter: 5.2 mm Liver: No focal lesion identified. Diffuse increased parenchymal echogenicity. IMPRESSION: 1. No acute findings. 2. Echogenic liver compatible with hepatic steatosis. Electronically Signed   By: Kerby Moors M.D.   On: 09/28/2016 16:02      IMPRESSION AND PLAN:   Acute pancreatitis, possible due to alcohol abuse. The patient will be admitted to medical floor. Keep nothing by mouth except medications water, IV fluids to support, and Zofran when necessary and pain control. Check lipid panel and follow-up lipase.  Abnormal liver function test and Hepatic steatosis. Possible due to alcohol abuse. Follow-up CMP.  Hypokalemia. Give potassium supplement, follow-up potassium and magnesium level. Hyponatremia. Give IV normal saline and follow-up BMP. Hypertension. Controlled without HTN medication. Morbid obesity. Check lipid panel. Alcohol abuse and alcohol withdrawal. Start CIWA protocol.  All the records are reviewed and case discussed with ED provider. Management plans discussed with the patient, His wife and they are in agreement.  CODE STATUS: Full code  TOTAL TIME TAKING CARE OF THIS PATIENT: 58 minutes.    [name] M.D on 09/28/2016 at 8:29 PM  Between 7am to 6pm - Pager - [phone]  After 6pm go to [email or web address] - [employer] [facility]  Office  [phone]  CC: Primary care physician; No PCP Per Patient   Note: This dictation was prepared with Dragon dictation along with smaller phrase technology. Any transcriptional errors that result from this process are unintentional.

## 2016-09-28 NOTE — ED Notes (Signed)
Dr. Paduchowski at bedside.  

## 2016-09-28 NOTE — ED Provider Notes (Signed)
[facility] Emergency Department Provider Note  Time seen: 3:22 PM  I have reviewed the triage vital signs and the nursing notes.   HISTORY  Chief Complaint Emesis and Nausea    HPI Tommy Gomez is a 36 y.o. male with a past medical history of IBS, hypertension, presents to the emergency department with diffuse abdominal pain left greater than right. Patient states for the past one week he has been nauseated and vomiting anytime he attempts to eat or drink. Patient denies fever or cough or congestion. States intermittent diarrhea. Patient does admit to daily alcohol use but does not drink and in 5 or 6 days. She describes his abdominal pain is severe, sharp constant pain all over his abdomen but especially on the left side. Denies dysuria.  Past Medical History:  Diagnosis Date  . Crohn disease ([place])   . Hypertension     There are no active problems to display for this patient.   Past Surgical History:  Procedure Laterality Date  . FACIAL FRACTURE SURGERY    . FASCIOTOMY      Prior to Admission medications   Medication Sig Start Date End Date Taking? Authorizing Provider  albuterol (PROVENTIL HFA;VENTOLIN HFA) 108 (90 Base) MCG/ACT inhaler Inhale 2 puffs into the lungs every 4 (four) hours as needed for wheezing or shortness of breath. 08/24/16   Charline Bills Cuthriell, PA-C  amoxicillin-clavulanate (AUGMENTIN) 875-125 MG tablet Take 1 tablet by mouth 2 (two) times daily. 08/24/16   Charline Bills Cuthriell, PA-C  azithromycin (ZITHROMAX Z-PAK) 250 MG tablet Take 2 tablets (500 mg) on  Day 1,  followed by 1 tablet (250 mg) once daily on Days 2 through 5. 08/24/16   Roderic Palau D Cuthriell, PA-C  butalbital-acetaminophen-caffeine (FIORICET, ESGIC) 50-325-40 MG tablet Take 1-2 tablets by mouth every 6 (six) hours as needed for headache. 08/02/16 08/02/17  Merlyn Lot, MD  cetirizine (ZYRTEC) 10 MG tablet Take 1 tablet (10 mg total) by mouth daily. 08/24/16    Charline Bills Cuthriell, PA-C  dicyclomine (BENTYL) 20 MG tablet Take 1 tablet (20 mg total) by mouth 3 (three) times daily as needed (abdominal pain). 10/08/15 10/07/16  Nance Pear, MD  famotidine (PEPCID) 20 MG tablet Take 1 tablet (20 mg total) by mouth daily. 04/26/16 05/26/16  Jami L Hagler, PA-C  fluticasone (FLONASE) 50 MCG/ACT nasal spray Place 1 spray into both nostrils 2 (two) times daily. 08/24/16   Charline Bills Cuthriell, PA-C  ondansetron (ZOFRAN) 4 MG tablet Take 1 tablet (4 mg total) by mouth every 8 (eight) hours as needed. 10/08/15   Nance Pear, MD  oxyCODONE-acetaminophen (ROXICET) 5-325 MG per tablet Take 1 tablet by mouth every 4 (four) hours as needed for severe pain. 04/05/15   Nance Pear, MD  predniSONE (DELTASONE) 50 MG tablet Take 1 tablet (50 mg total) by mouth daily with breakfast. 08/24/16   Charline Bills Cuthriell, PA-C  sulfamethoxazole-trimethoprim (BACTRIM DS,SEPTRA DS) 800-160 MG tablet Take 1 tablet by mouth 2 (two) times daily. 11/23/15   Duanne Guess, PA-C    No Known Allergies  History reviewed. No pertinent family history.  Social History Social History  Substance Use Topics  . Smoking status: Never Smoker  . Smokeless tobacco: Current User  . Alcohol use Yes     Comment: Occ    Review of Systems Constitutional: Negative for fever Cardiovascular: Negative for chest pain. Respiratory: Negative for shortness of breath. Gastrointestinal: Diffuse abdominal pain left greater than right. Positive for nausea  and vomiting. Occasional diarrhea. Genitourinary: Negative for dysuria. Musculoskeletal: Negative for back pain. Neurological: Negative for headache 10-point ROS otherwise negative.  ____________________________________________   PHYSICAL EXAM:  VITAL SIGNS: ED Triage Vitals [09/28/16 1228]  Enc Vitals Group     BP (!) 158/107     Pulse Rate (!) 107     Resp 20     Temp 97.7 F (36.5 C)     Temp Source Oral     SpO2 100 %      Weight 240 lb (108.9 kg)     Height 6' 1"  (1.854 m)     Head Circumference      Peak Flow      Pain Score 8     Pain Loc      Pain Edu?      Excl. in [place]?     Constitutional: Alert and oriented. Well appearing and in no distress. Eyes: Normal exam ENT   Head: Normocephalic and atraumatic.   Mouth/Throat: Mucous membranes are moist. Cardiovascular: Normal rate, regular rhythm. No murmur Respiratory: Normal respiratory effort without tachypnea nor retractions. Breath sounds are clear  Gastrointestinal: Soft, fairly diffuse abdominal tenderness to palpation, moderate tenderness especially in the left lower quadrant. Mild to moderate right upper quadrant and epigastric pain as well. Musculoskeletal: Nontender with normal range of motion in all extremities.  Neurologic:  Normal speech and language. No gross focal neurologic deficits  Skin:  Skin is warm, dry and intact.  Psychiatric: Mood and affect are normal.  ____________________________________________     RADIOLOGY  CT negative Ultrasound shows hepatic steatosis otherwise negative  ____________________________________________   INITIAL IMPRESSION / ASSESSMENT AND PLAN / ED COURSE  Pertinent labs & imaging results that were available during my care of the patient were reviewed by me and considered in my medical decision making (see chart for details).  Patient presents the emergency department with 1 week of nausea and vomiting, diffuse abdominal pain. Patient's labs show a lipase of 350, elevated LFTs. Normal white blood cell count. In and Of 17. We'll continue with IV hydration, treat the patient's pain and nausea. We'll proceed with a right upper quadrant ultrasound to further evaluate will likely proceed with a CT scan as well given his diffuse abdominal pain especially in the left lower quadrant.  Patient's ultrasound and CT are largely within normal limits. Patient's lipase is elevated 350 continues to have  discomfort and nausea. We will continue with IV hydration and admitted to the hospital for acute pancreatitis.  ____________________________________________   FINAL CLINICAL IMPRESSION(S) / ED DIAGNOSES  abdominal pain vomiting pancreatits Acute pancreatitis Dehydration   Harvest Dark, MD 09/28/16 [phone]

## 2016-09-28 NOTE — ED Notes (Signed)
Pt states alcoholic for a long time, quit drinking 1 week ago

## 2016-09-28 NOTE — ED Notes (Signed)
Pt stating that he is having a hard time getting contrast done, "I still feel like shit". Pt informed to try to get as much oral contrast down as possible and will inform Dr. Kerman Passey.

## 2016-09-28 NOTE — ED Notes (Signed)
CT called asking about oral contrast, informed of progress. Dr. Kerman Passey stated that CT could take him at this time. CT informed.

## 2016-09-29 LAB — LIPID PANEL
CHOL/HDL RATIO: 4.3 ratio
CHOLESTEROL: 102 mg/dL (ref 0–200)
HDL: 24 mg/dL — AB (ref >40)
LDL Cholesterol: 3 mg/dL (ref 0–99)
Triglycerides: 373 mg/dL — ABNORMAL HIGH (ref <150)
VLDL: 75 mg/dL — ABNORMAL HIGH (ref 0–40)

## 2016-09-29 LAB — URINALYSIS, COMPLETE (UACMP) WITH MICROSCOPIC
BACTERIA UA: NONE SEEN
BILIRUBIN URINE: NEGATIVE
Glucose, UA: NEGATIVE mg/dL
Hgb urine dipstick: NEGATIVE
KETONES UR: 20 mg/dL — AB
LEUKOCYTES UA: NEGATIVE
Nitrite: NEGATIVE
PH: 6 (ref 5.0–8.0)
Protein, ur: 30 mg/dL — AB
Specific Gravity, Urine: 1.043 — ABNORMAL HIGH (ref 1.005–1.030)

## 2016-09-29 LAB — CBC
HCT: 41.5 % (ref 40.0–52.0)
HEMOGLOBIN: 14.7 g/dL (ref 13.0–18.0)
MCH: 32 pg (ref 26.0–34.0)
MCHC: 35.5 g/dL (ref 32.0–36.0)
MCV: 90.4 fL (ref 80.0–100.0)
Platelets: 112 10*3/uL — ABNORMAL LOW (ref 150–440)
RBC: 4.6 MIL/uL (ref 4.40–5.90)
RDW: 13.9 % (ref 11.5–14.5)
WBC: 5.9 10*3/uL (ref 3.8–10.6)

## 2016-09-29 LAB — LIPASE, BLOOD: LIPASE: 256 U/L — AB (ref 11–51)

## 2016-09-29 MED ORDER — METHADONE HCL 10 MG/ML PO CONC
100.0000 mg | Freq: Every day | ORAL | Status: DC
  Administered 2016-09-29 – 2016-10-01 (×3): 100 mg via ORAL
  Filled 2016-09-29 (×3): qty 10

## 2016-09-29 MED ORDER — LOPERAMIDE HCL 2 MG PO CAPS
2.0000 mg | ORAL_CAPSULE | Freq: Four times a day (QID) | ORAL | Status: DC | PRN
  Administered 2016-09-29 – 2016-09-30 (×5): 2 mg via ORAL
  Filled 2016-09-29 (×5): qty 1

## 2016-09-29 NOTE — Progress Notes (Signed)
[place] at [place] NAME: Tommy Gomez    MR#:  [id number]  DATE OF BIRTH:  1981-03-12  SUBJECTIVE:   Pt. Here due to acute pancreatitis and having some diarrhea. Abdominal pain improved. No N/V.  Lipase improving.  Still having some Diarrhea.   REVIEW OF SYSTEMS:    Review of Systems  Constitutional: Negative for chills and fever.  HENT: Negative for congestion and tinnitus.   Eyes: Negative for blurred vision and double vision.  Respiratory: Negative for cough, shortness of breath and wheezing.   Cardiovascular: Negative for chest pain, orthopnea and PND.  Gastrointestinal: Positive for abdominal pain (epigastric) and diarrhea. Negative for nausea and vomiting.  Genitourinary: Negative for dysuria and hematuria.  Neurological: Negative for dizziness, sensory change and focal weakness.  All other systems reviewed and are negative.   Nutrition: Clear Liquids Tolerating Diet: Yes Tolerating PT: Ambulatory   DRUG ALLERGIES:  No Known Allergies  VITALS:  Blood pressure 139/90, pulse 69, temperature 97.6 F (36.4 C), temperature source Oral, resp. rate 20, height 6' 1"  (1.854 m), weight 114.6 kg (252 lb 9.6 oz), SpO2 100 %.  PHYSICAL EXAMINATION:   Physical Exam  GENERAL:  36 y.o.-year-old obese patient lying in the bed in no acute distress.  EYES: Pupils equal, round, reactive to light and accommodation. No scleral icterus. Extraocular muscles intact.  HEENT: Head atraumatic, normocephalic. Oropharynx and nasopharynx clear.  NECK:  Supple, no jugular venous distention. No thyroid enlargement, no tenderness.  LUNGS: Normal breath sounds bilaterally, no wheezing, rales, rhonchi. No use of accessory muscles of respiration.  CARDIOVASCULAR: S1, S2 normal. No murmurs, rubs, or gallops.  ABDOMEN: Soft, tender in epigastric area, no rebound, rigidity, nondistended. Bowel sounds present. No organomegaly or mass.  EXTREMITIES: No  cyanosis, clubbing or edema b/l.    NEUROLOGIC: Cranial nerves II through XII are intact. No focal Motor or sensory deficits b/l.   PSYCHIATRIC: The patient is alert and oriented x 3.  SKIN: No obvious rash, lesion, or ulcer.    LABORATORY PANEL:   CBC  Recent Labs Lab 09/29/16 0308  WBC 5.9  HGB 14.7  HCT 41.5  PLT 112*   ------------------------------------------------------------------------------------------------------------------  Chemistries   Recent Labs Lab 09/28/16 2107  NA 136  K 3.1*  CL 103  CO2 21*  GLUCOSE 104*  BUN 16  CREATININE 1.28*  CALCIUM 7.8*  AST 156*  ALT 110*  ALKPHOS 114  BILITOT 2.5*   ------------------------------------------------------------------------------------------------------------------  Cardiac Enzymes No results for input(s): TROPONINI in the last 168 hours. ------------------------------------------------------------------------------------------------------------------  RADIOLOGY:  Ct Abdomen Pelvis W Contrast  Result Date: 09/28/2016 CLINICAL DATA:  LEFT-sided flank pain EXAM: CT ABDOMEN AND PELVIS WITH CONTRAST TECHNIQUE: Multidetector CT imaging of the abdomen and pelvis was performed using the standard protocol following bolus administration of intravenous contrast. CONTRAST:  6m ISOVUE-300 IOPAMIDOL (ISOVUE-300) INJECTION 61%, 1074mISOVUE-300 IOPAMIDOL (ISOVUE-300) INJECTION 61% COMPARISON:  CT 10/08/2015 FINDINGS: Lower chest: Lung bases are clear. Hepatobiliary: Diffuse low-attenuation liver. Normal gallbladder. No duct dilatation. Pancreas: Pancreas is normal. No ductal dilatation. No pancreatic inflammation. Spleen: Normal spleen Adrenals/urinary tract: Adrenal glands and kidneys are normal. The ureters and bladder normal. Stomach/Bowel: Stomach, small bowel, appendix, and cecum are normal. The colon and rectosigmoid colon are normal. Vascular/Lymphatic: Abdominal aorta is normal caliber. There is no  retroperitoneal or periportal lymphadenopathy. No pelvic lymphadenopathy. Reproductive: Number prostate gland. Other: No free fluid. Musculoskeletal: No aggressive osseous lesion. IMPRESSION: 1. Hepatic steatosis. 2. No  acute findings in the abdomen pelvis. Electronically Signed   By: Suzy Bouchard M.D.   On: 09/28/2016 18:07   US Abdomen Limited Ruq  Result Date: 09/28/2016 CLINICAL DATA:  Elevated LFTs. EXAM: US ABDOMEN LIMITED - RIGHT UPPER QUADRANT COMPARISON:  04/10/2009 FINDINGS: Gallbladder: No gallstones or wall thickening visualized. No sonographic Murphy sign noted by sonographer. Common bile duct: Diameter: 5.2 mm Liver: No focal lesion identified. Diffuse increased parenchymal echogenicity. IMPRESSION: 1. No acute findings. 2. Echogenic liver compatible with hepatic steatosis. Electronically Signed   By: Kerby Moors M.D.   On: 09/28/2016 16:02     ASSESSMENT AND PLAN:   36 year old male with past medical history of chronic pain on methadone, history of Crohn's disease, essential hypertension, noted to the hospital due to abdominal pain nausea vomiting and diarrhea noted to have acute pancreatitis.   1. Acute pancreatitis-secondary to alcohol. Patient's lipid profile is normal. -Continue supportive care with IV fluids, antibiotics, pain control. Lipase is improved. We'll start on clear liquid diet. Abdominal ultrasound and CT scan of the abdomen and pelvis showing just hepatic steatosis.  2. Hypokalemia-continue supplemental and repeat level in the morning.  3. Hyponatremia-improved with IV fluids.  4. Abnormal LFTs-secondary to alcohol abuse. LFTs are improving. Abdominal ultrasound and CT scan abdomen pelvis showing just hepatic steatosis.  5. Alcohol abuse-continue CIWA protocol. Cont. Thiamine Folate.   6. Chronic Pain - cont. Methadone. Dose confirmed by pharmacy.   7. Diarrhea - PRN imodium. If persistent consider doing PCR.   All the records are reviewed and case  discussed with Care Management/Social Worker. Management plans discussed with the patient, family and they are in agreement.  CODE STATUS: Full code  DVT Prophylaxis: Lovenox  TOTAL TIME TAKING CARE OF THIS PATIENT: 30 minutes.   POSSIBLE D/C IN 1-2 DAYS, DEPENDING ON CLINICAL CONDITION.   Henreitta Leber M.D on 09/29/2016 at 5:41 PM  Between 7am to 6pm - Pager - [phone]  After 6pm go to [email or web address] - [employer] [facility]  Office  [phone]  CC: Primary care physician; No PCP Per Patient

## 2016-09-29 NOTE — Progress Notes (Signed)
Called Dr. Jannifer Franklin regarding methadone per patient request.  Doctor instructed me to call pharmacy to verify that patient takes methadone.  Called pharmacy and spoke to Merkel.  He asked for the name of the clinic and I gave him the information.  Nate left a message with the clinic.  Christene Slates  09/29/2016  2:48 AM

## 2016-09-29 NOTE — Plan of Care (Signed)
Problem: Nutrition: Goal: Adequate nutrition will be maintained Outcome: Not Progressing Patient is NPO except ice chips.

## 2016-09-30 LAB — COMPREHENSIVE METABOLIC PANEL
ALBUMIN: 2.7 g/dL — AB (ref 3.5–5.0)
ALT: 102 U/L — ABNORMAL HIGH (ref 17–63)
AST: 175 U/L — ABNORMAL HIGH (ref 15–41)
Alkaline Phosphatase: 87 U/L (ref 38–126)
Anion gap: 7 (ref 5–15)
BUN: 9 mg/dL (ref 6–20)
CHLORIDE: 110 mmol/L (ref 101–111)
CO2: 21 mmol/L — ABNORMAL LOW (ref 22–32)
Calcium: 7.6 mg/dL — ABNORMAL LOW (ref 8.9–10.3)
Creatinine, Ser: 1.06 mg/dL (ref 0.61–1.24)
GFR calc Af Amer: >60 mL/min (ref >60)
GFR calc non Af Amer: >60 mL/min (ref >60)
GLUCOSE: 91 mg/dL (ref 65–99)
POTASSIUM: 3.8 mmol/L (ref 3.5–5.1)
Sodium: 138 mmol/L (ref 135–145)
Total Bilirubin: 2.1 mg/dL — ABNORMAL HIGH (ref 0.3–1.2)
Total Protein: 5.7 g/dL — ABNORMAL LOW (ref 6.5–8.1)

## 2016-09-30 LAB — LIPASE, BLOOD: LIPASE: 176 U/L — AB (ref 11–51)

## 2016-09-30 MED ORDER — KETOROLAC TROMETHAMINE 30 MG/ML IJ SOLN
30.0000 mg | Freq: Four times a day (QID) | INTRAMUSCULAR | Status: DC | PRN
  Administered 2016-09-30 – 2016-10-01 (×3): 30 mg via INTRAVENOUS
  Filled 2016-09-30 (×3): qty 1

## 2016-09-30 NOTE — Progress Notes (Signed)
[place] at [place] NAME: Tommy Gomez    MR#:  [id number]  DATE OF BIRTH:  May 08, 1981  SUBJECTIVE:   Pt. Here due to acute pancreatitis and having some diarrhea.  Still Abdominal pain and nausea and Diarrhea.   REVIEW OF SYSTEMS:    Review of Systems  Constitutional: Negative for chills and fever.  HENT: Negative for congestion and tinnitus.   Eyes: Negative for blurred vision and double vision.  Respiratory: Negative for cough, shortness of breath and wheezing.   Cardiovascular: Negative for chest pain, orthopnea and PND.  Gastrointestinal: Positive for abdominal pain (epigastric), diarrhea and nausea. Negative for vomiting.  Genitourinary: Negative for dysuria and hematuria.  Neurological: Negative for dizziness, sensory change and focal weakness.  All other systems reviewed and are negative.   Nutrition: Clear Liquids Tolerating Diet: Yes Tolerating PT: Ambulatory   DRUG ALLERGIES:  No Known Allergies  VITALS:  Blood pressure 128/82, pulse 76, temperature 97.6 F (36.4 C), temperature source Oral, resp. rate 18, height 6' 1"  (1.854 m), weight 252 lb 9.6 oz (114.6 kg), SpO2 98 %.  PHYSICAL EXAMINATION:   Physical Exam  GENERAL:  36 y.o.-year-old obese patient lying in the bed in no acute distress.  EYES: Pupils equal, round, reactive to light and accommodation. No scleral icterus. Extraocular muscles intact.  HEENT: Head atraumatic, normocephalic. Oropharynx and nasopharynx clear.  NECK:  Supple, no jugular venous distention. No thyroid enlargement, no tenderness.  LUNGS: Normal breath sounds bilaterally, no wheezing, rales, rhonchi. No use of accessory muscles of respiration.  CARDIOVASCULAR: S1, S2 normal. No murmurs, rubs, or gallops.  ABDOMEN: Soft, tender in epigastric area, no rebound, rigidity, nondistended. Bowel sounds present. No organomegaly or mass.  EXTREMITIES: No cyanosis, clubbing or edema b/l.     NEUROLOGIC: Cranial nerves II through XII are intact. No focal Motor or sensory deficits b/l.   PSYCHIATRIC: The patient is alert and oriented x 3.  SKIN: No obvious rash, lesion, or ulcer.    LABORATORY PANEL:   CBC  Recent Labs Lab 09/29/16 0308  WBC 5.9  HGB 14.7  HCT 41.5  PLT 112*   ------------------------------------------------------------------------------------------------------------------  Chemistries   Recent Labs Lab 09/30/16 0455  NA 138  K 3.8  CL 110  CO2 21*  GLUCOSE 91  BUN 9  CREATININE 1.06  CALCIUM 7.6*  AST 175*  ALT 102*  ALKPHOS 87  BILITOT 2.1*   ------------------------------------------------------------------------------------------------------------------  Cardiac Enzymes No results for input(s): TROPONINI in the last 168 hours. ------------------------------------------------------------------------------------------------------------------  RADIOLOGY:  No results found.   ASSESSMENT AND PLAN:   37 year old male with past medical history of chronic pain on methadone, history of Crohn's disease, essential hypertension, noted to the hospital due to abdominal pain nausea vomiting and diarrhea noted to have acute pancreatitis.   1. Acute pancreatitis-secondary to alcohol. Patient's lipid profile is normal. -Continue supportive care with IV fluids, antibiotics, pain control. Lipase is improving.  Advanced to full liquid diet. Abdominal ultrasound and CT scan of the abdomen and pelvis showing just hepatic steatosis.  2. Hypokalemia-improved with supplement.  3. Hyponatremia-improved with IV fluids.  4. Abnormal LFTs-secondary to alcohol abuse. LFTs are improving. Abdominal ultrasound and CT scan abdomen pelvis showing just hepatic steatosis.  5. Alcohol abuse-continue CIWA protocol. Cont. Thiamine Folate.   6. Chronic Pain - cont. Methadone. Dose confirmed by pharmacy.   7. Diarrhea - PRN imodium. If persistent consider  doing PCR.   All the records are reviewed  and case discussed with Care Management/Social Worker. Management plans discussed with the patient, family and they are in agreement.  CODE STATUS: Full code  DVT Prophylaxis: Lovenox  TOTAL TIME TAKING CARE OF THIS PATIENT: 33 minutes.   POSSIBLE D/C IN 1-2 DAYS, DEPENDING ON CLINICAL CONDITION.   Tommy Gomez M.D on 09/30/2016 at 5:52 PM  Between 7am to 6pm - Pager - [phone]  After 6pm go to [email or web address] - [employer] [facility]  Office  [phone]  CC: Primary care physician; No PCP Per Patient

## 2016-10-01 MED ORDER — ALPRAZOLAM 0.25 MG PO TABS: 0.2500 mg | ORAL_TABLET | Freq: Three times a day (TID) | ORAL | Status: DC | PRN

## 2016-10-01 MED ORDER — LOPERAMIDE HCL 2 MG PO CAPS: 2.0000 mg | ORAL_CAPSULE | Freq: Four times a day (QID) | ORAL | 0 refills | Status: AC | PRN

## 2016-10-01 MED ORDER — METHADONE HCL 10 MG/ML PO CONC: 100.0000 mg | Freq: Every day | ORAL | 0 refills | Status: AC

## 2016-10-01 NOTE — Progress Notes (Signed)
Alert and oriented. Vital signs stable . No signs of acute distress. Discharge instructions given. Patient verbalized understanding. No other issues noted at this time.    

## 2016-10-01 NOTE — Discharge Instructions (Signed)
Heart healthy diet. Alcohol detox as outpatient.

## 2016-10-01 NOTE — Discharge Summary (Signed)
[facility] [place] NAME: Tommy Gomez    MR#:  [id number]  DATE OF BIRTH:  Jun 27, 1981  DATE OF ADMISSION:  09/28/2016   ADMITTING PHYSICIAN: [name], MD  DATE OF DISCHARGE: 10/01/2016 12:57 PM  PRIMARY CARE PHYSICIAN: No PCP Per Patient   ADMISSION DIAGNOSIS:  Alcohol-induced acute pancreatitis, unspecified complication status [B28.41] DISCHARGE DIAGNOSIS:  Active Problems:   Acute pancreatitis  SECONDARY DIAGNOSIS:   Past Medical History:  Diagnosis Date  . Crohn disease ([place])   . Hypertension    HOSPITAL COURSE:   -year-old male with past medical history of chronic pain on methadone, history of Crohn's disease, essential hypertension, noted to the hospital due to abdominal pain nausea vomiting and diarrhea noted to have acute pancreatitis.   1. Acute pancreatitis-secondary to alcohol. Patient's lipid profile is normal. He was treated with IV fluids, pain control. Lipase is improving.  He tolerated heart healthy diet. Abdominal ultrasound and CT scan of the abdomen and pelvis showing just hepatic steatosis.  2. Hypokalemia-improved with supplement.  3. Hyponatremia-improved with IV fluids.  4. Abnormal LFTs-secondary to alcohol abuse. LFTs are improving. Abdominal ultrasound and CT scan abdomen pelvis showing just hepatic steatosis.  5. Alcohol abuse-continue CIWA protocol. Cont. Thiamine Folate.   6. Chronic Pain - cont. Methadone. Dose confirmed by pharmacy.   7. Diarrhea - PRN imodium.   DISCHARGE CONDITIONS:  Stable, discharged to home today. CONSULTS OBTAINED:   DRUG ALLERGIES:  No Known Allergies DISCHARGE MEDICATIONS:   Allergies as of 10/01/2016   No Known Allergies     Medication List    STOP taking these medications   butalbital-acetaminophen-caffeine 50-325-40 MG tablet Commonly known as:  FIORICET, ESGIC   oxyCODONE-acetaminophen 5-325 MG tablet Commonly known as:  ROXICET     predniSONE 50 MG tablet Commonly known as:  DELTASONE     TAKE these medications   albuterol 108 (90 Base) MCG/ACT inhaler Commonly known as:  PROVENTIL HFA;VENTOLIN HFA Inhale 2 puffs into the lungs every 4 (four) hours as needed for wheezing or shortness of breath.   cetirizine 10 MG tablet Commonly known as:  ZYRTEC Take 1 tablet (10 mg total) by mouth daily.   dicyclomine 20 MG tablet Commonly known as:  BENTYL Take 1 tablet (20 mg total) by mouth 3 (three) times daily as needed (abdominal pain).   famotidine 20 MG tablet Commonly known as:  PEPCID Take 1 tablet (20 mg total) by mouth daily.   fluticasone 50 MCG/ACT nasal spray Commonly known as:  FLONASE Place 1 spray into both nostrils 2 (two) times daily.   ibuprofen 200 MG tablet Commonly known as:  ADVIL,MOTRIN Take 200 mg by mouth every 6 (six) hours as needed.   loperamide 2 MG capsule Commonly known as:  IMODIUM Take 1 capsule (2 mg total) by mouth 4 (four) times daily as needed for diarrhea or loose stools.   methadone 10 MG/ML solution Commonly known as:  DOLOPHINE Take 10 mLs (100 mg total) by mouth daily.   ondansetron 4 MG tablet Commonly known as:  ZOFRAN Take 1 tablet (4 mg total) by mouth every 8 (eight) hours as needed.        DISCHARGE INSTRUCTIONS:  See AVS. If you experience worsening of your admission symptoms, develop shortness of breath, life threatening emergency, suicidal or homicidal thoughts you must seek medical attention immediately by calling 911 or calling your MD immediately  if symptoms less severe.  You  Must read complete instructions/literature along with all the possible adverse reactions/side effects for all the Medicines you take and that have been prescribed to you. Take any new Medicines after you have completely understood and accpet all the possible adverse reactions/side effects.   Please note  You were cared for by a hospitalist during your hospital stay. If you  have any questions about your discharge medications or the care you received while you were in the hospital after you are discharged, you can call the unit and asked to speak with the hospitalist on call if the hospitalist that took care of you is not available. Once you are discharged, your primary care physician will handle any further medical issues. Please note that NO REFILLS for any discharge medications will be authorized once you are discharged, as it is imperative that you return to your primary care physician (or establish a relationship with a primary care physician if you do not have one) for your aftercare needs so that they can reassess your need for medications and monitor your lab values.    On the day of Discharge:  VITAL SIGNS:  Blood pressure (!) 156/92, pulse 63, temperature 97.3 F (36.3 C), temperature source Oral, resp. rate 16, height 6' 1"  (1.854 m), weight 252 lb 9.6 oz (114.6 kg), SpO2 98 %. PHYSICAL EXAMINATION:  GENERAL:  36 y.o.-year-old patient lying in the bed with no acute distress. Obese. EYES: Pupils equal, round, reactive to light and accommodation. No scleral icterus. Extraocular muscles intact.  HEENT: Head atraumatic, normocephalic. Oropharynx and nasopharynx clear.  NECK:  Supple, no jugular venous distention. No thyroid enlargement, no tenderness.  LUNGS: Normal breath sounds bilaterally, no wheezing, rales,rhonchi or crepitation. No use of accessory muscles of respiration.  CARDIOVASCULAR: S1, S2 normal. No murmurs, rubs, or gallops.  ABDOMEN: Soft, non-tender, non-distended. Bowel sounds present. No organomegaly or mass.  EXTREMITIES: No pedal edema, cyanosis, or clubbing.  NEUROLOGIC: Cranial nerves II through XII are intact. Muscle strength 5/5 in all extremities. Sensation intact. Gait not checked.  PSYCHIATRIC: The patient is alert and oriented x 3.  SKIN: No obvious rash, lesion, or ulcer.  DATA REVIEW:   CBC  Recent Labs Lab 09/29/16 0308    WBC 5.9  HGB 14.7  HCT 41.5  PLT 112*    Chemistries   Recent Labs Lab 09/30/16 0455  NA 138  K 3.8  CL 110  CO2 21*  GLUCOSE 91  BUN 9  CREATININE 1.06  CALCIUM 7.6*  AST 175*  ALT 102*  ALKPHOS 87  BILITOT 2.1*     Microbiology Results  No results found for this or any previous visit.  RADIOLOGY:  No results found.   Management plans discussed with the patient, family and they are in agreement.  CODE STATUS:  Code Status History    Date Active Date Inactive Code Status Order ID Comments User Context   09/28/2016  8:54 PM 10/01/2016  3:57 PM Full Code [id number]  [name], MD Inpatient      TOTAL TIME TAKING CARE OF THIS PATIENT: 33 minutes.    [name] M.D on 10/01/2016 at 5:02 PM  Between 7am to 6pm - Pager - [phone]  After 6pm go to [email or web address] - [employer] [facility]  Office  [phone]  CC: Primary care physician; No PCP Per Patient   Note: This dictation was prepared with Dragon dictation along with smaller phrase technology. Any transcriptional errors that result from this process  are unintentional.

## 2016-12-15 ENCOUNTER — Emergency Department
Admission: EM | Admit: 2016-12-15 | Discharge: 2016-12-15 | Disposition: A | Discharge status: Home / Self Care | Payer: No Typology Code available for payment source | Attending: Emergency Medicine | Admitting: Emergency Medicine

## 2016-12-15 DIAGNOSIS — Y92410 Unspecified street and highway as the place of occurrence of the external cause: Secondary | ICD-10-CM | POA: Insufficient documentation

## 2016-12-15 DIAGNOSIS — Y999 Unspecified external cause status: Secondary | ICD-10-CM | POA: Diagnosis not present

## 2016-12-15 DIAGNOSIS — Z791 Long term (current) use of non-steroidal anti-inflammatories (NSAID): Secondary | ICD-10-CM | POA: Diagnosis not present

## 2016-12-15 DIAGNOSIS — K5090 Crohn's disease, unspecified, without complications: Secondary | ICD-10-CM | POA: Diagnosis not present

## 2016-12-15 DIAGNOSIS — F17200 Nicotine dependence, unspecified, uncomplicated: Secondary | ICD-10-CM | POA: Insufficient documentation

## 2016-12-15 DIAGNOSIS — S39012A Strain of muscle, fascia and tendon of lower back, initial encounter: Secondary | ICD-10-CM | POA: Diagnosis not present

## 2016-12-15 DIAGNOSIS — Y939 Activity, unspecified: Secondary | ICD-10-CM | POA: Diagnosis not present

## 2016-12-15 DIAGNOSIS — S3992XA Unspecified injury of lower back, initial encounter: Secondary | ICD-10-CM | POA: Diagnosis present

## 2016-12-15 DIAGNOSIS — M7918 Myalgia, other site: Secondary | ICD-10-CM

## 2016-12-15 DIAGNOSIS — I10 Essential (primary) hypertension: Secondary | ICD-10-CM | POA: Diagnosis not present

## 2016-12-15 DIAGNOSIS — Z79899 Other long term (current) drug therapy: Secondary | ICD-10-CM | POA: Insufficient documentation

## 2016-12-15 MED ORDER — TRAMADOL HCL 50 MG PO TABS: 50.0000 mg | ORAL_TABLET | Freq: Four times a day (QID) | ORAL | 0 refills | Status: AC | PRN

## 2016-12-15 MED ORDER — PSEUDOEPH-BROMPHEN-DM 30-2-10 MG/5ML PO SYRP: 5.0000 mL | ORAL_SOLUTION | Freq: Four times a day (QID) | ORAL | 0 refills | Status: AC | PRN

## 2016-12-15 MED ORDER — CYCLOBENZAPRINE HCL 10 MG PO TABS: 10.0000 mg | ORAL_TABLET | Freq: Three times a day (TID) | ORAL | 0 refills | Status: AC | PRN

## 2016-12-15 MED ORDER — IBUPROFEN 600 MG PO TABS: 600.0000 mg | ORAL_TABLET | Freq: Three times a day (TID) | ORAL | 0 refills | Status: DC | PRN

## 2016-12-15 NOTE — ED Notes (Signed)
Pt discharged home after verbalizing understanding of discharge instructions; nad noted. 
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
## 2018-08-03 ENCOUNTER — Other Ambulatory Visit: Payer: Self-pay

## 2018-08-03 ENCOUNTER — Emergency Department: Payer: Self-pay

## 2018-08-03 ENCOUNTER — Emergency Department
Admission: EM | Admit: 2018-08-03 | Discharge: 2018-08-03 | Disposition: A | Discharge status: Home / Self Care | Payer: Self-pay | Attending: Emergency Medicine | Admitting: Emergency Medicine

## 2018-08-03 ENCOUNTER — Encounter: Payer: Self-pay | Admitting: Emergency Medicine

## 2018-08-03 DIAGNOSIS — F17228 Nicotine dependence, chewing tobacco, with other nicotine-induced disorders: Secondary | ICD-10-CM | POA: Insufficient documentation

## 2018-08-03 DIAGNOSIS — K439 Ventral hernia without obstruction or gangrene: Secondary | ICD-10-CM | POA: Insufficient documentation

## 2018-08-03 DIAGNOSIS — K429 Umbilical hernia without obstruction or gangrene: Secondary | ICD-10-CM | POA: Insufficient documentation

## 2018-08-03 DIAGNOSIS — I10 Essential (primary) hypertension: Secondary | ICD-10-CM | POA: Insufficient documentation

## 2018-08-03 MED ORDER — MORPHINE SULFATE (PF) 4 MG/ML IV SOLN
6.0000 mg | Freq: Once | INTRAVENOUS | Status: AC
  Administered 2018-08-03: 6 mg via INTRAMUSCULAR
  Filled 2018-08-03: qty 2

## 2018-08-03 NOTE — ED Notes (Signed)
Pt taken to xray 

## 2018-08-03 NOTE — ED Provider Notes (Signed)
[facility] Emergency Department Provider Note  ____________________________________________  Time seen: Approximately 5:41 PM  I have reviewed the triage vital signs and the nursing notes.   HISTORY  Chief Complaint Hernia   HPI Tommy Gomez is a 37 y.o. male with history of Crohn's disease and hypertension who presents for evaluation of umbilical hernia.  Patient has noted the umbilical hernia several times in the past.  Usually easily reducible.  Last night he noticed that the hernia was much bigger and he had severe abdominal pain.  He was unable to reduce it.  He took some medication for gas over-the-counter and this morning it was markedly improved but he still noticed a small hernia he was unable to reduce.  He is not having any significant pain, no nausea or vomiting, he is passing flatus, having normal bowel movements.  Patient denies any prior abdominal surgeries.  Past Medical History:  Diagnosis Date  . Crohn disease ([place])   . Hypertension     Patient Active Problem List   Diagnosis Date Noted  . Acute pancreatitis 09/28/2016    Past Surgical History:  Procedure Laterality Date  . FACIAL FRACTURE SURGERY    . FASCIOTOMY      Prior to Admission medications   Medication Sig Start Date End Date Taking? Authorizing Provider  albuterol (PROVENTIL HFA;VENTOLIN HFA) 108 (90 Base) MCG/ACT inhaler Inhale 2 puffs into the lungs every 4 (four) hours as needed for wheezing or shortness of breath. Patient not taking: Reported on 09/28/2016 08/24/16   Cuthriell, Charline Bills, PA-C  brompheniramine-pseudoephedrine-DM 30-2-10 MG/5ML syrup Take 5 mLs by mouth 4 (four) times daily as needed. 12/15/16   Sable Feil, PA-C  cetirizine (ZYRTEC) 10 MG tablet Take 1 tablet (10 mg total) by mouth daily. Patient not taking: Reported on 09/28/2016 08/24/16   Cuthriell, Charline Bills, PA-C  cyclobenzaprine (FLEXERIL) 10 MG tablet Take 1 tablet (10 mg total) by mouth  3 (three) times daily as needed. 12/15/16   Sable Feil, PA-C  dicyclomine (BENTYL) 20 MG tablet Take 1 tablet (20 mg total) by mouth 3 (three) times daily as needed (abdominal pain). Patient not taking: Reported on 09/28/2016 10/08/15 10/07/16  Nance Pear, MD  famotidine (PEPCID) 20 MG tablet Take 1 tablet (20 mg total) by mouth daily. 04/26/16 09/28/17  Hagler, Jami L, PA-C  fluticasone (FLONASE) 50 MCG/ACT nasal spray Place 1 spray into both nostrils 2 (two) times daily. Patient not taking: Reported on 09/28/2016 08/24/16   Cuthriell, Charline Bills, PA-C  ibuprofen (ADVIL,MOTRIN) 200 MG tablet Take 200 mg by mouth every 6 (six) hours as needed.    [provider]  ibuprofen (ADVIL,MOTRIN) 600 MG tablet Take 1 tablet (600 mg total) by mouth every 8 (eight) hours as needed. 12/15/16   Sable Feil, PA-C  loperamide (IMODIUM) 2 MG capsule Take 1 capsule (2 mg total) by mouth 4 (four) times daily as needed for diarrhea or loose stools. 10/01/16   [name], MD  methadone (DOLOPHINE) 10 MG/ML solution Take 10 mLs (100 mg total) by mouth daily. 10/01/16   [name], MD  ondansetron (ZOFRAN) 4 MG tablet Take 1 tablet (4 mg total) by mouth every 8 (eight) hours as needed. 10/08/15   Nance Pear, MD  traMADol (ULTRAM) 50 MG tablet Take 1 tablet (50 mg total) by mouth every 6 (six) hours as needed for moderate pain. 12/15/16   Sable Feil, PA-C    Allergies Patient has no  known allergies.  Family History  Problem Relation Age of Onset  . Hypertension Father     Social History Social History   Tobacco Use  . Smoking status: Never Smoker  . Smokeless tobacco: Current User  Substance Use Topics  . Alcohol use: Yes    Comment: occasional  . Drug use: No    Review of Systems  Constitutional: Negative for fever. Eyes: Negative for visual changes. ENT: Negative for sore throat. Neck: No neck pain  Cardiovascular: Negative for chest pain. Respiratory: Negative for shortness  of breath. Gastrointestinal: Negative for abdominal pain, vomiting or diarrhea. + umbilical hernia Genitourinary: Negative for dysuria. Musculoskeletal: Negative for back pain. Skin: Negative for rash. Neurological: Negative for headaches, weakness or numbness. Psych: No SI or HI  ____________________________________________   PHYSICAL EXAM:  VITAL SIGNS: ED Triage Vitals  Enc Vitals Group     BP 08/03/18 1526 126/73     Pulse Rate 08/03/18 1526 99     Resp 08/03/18 1526 16     Temp 08/03/18 1526 98.1 F (36.7 C)     Temp Source 08/03/18 1526 Oral     SpO2 08/03/18 1526 97 %     Weight 08/03/18 1531 210 lb (95.3 kg)     Height 08/03/18 1531 6' (1.829 m)     Head Circumference --      Peak Flow --      Pain Score 08/03/18 1530 7     Pain Loc --      Pain Edu? --      Excl. in [place]? --     Constitutional: Alert and oriented. Well appearing and in no apparent distress. HEENT:      Head: Normocephalic and atraumatic.         Eyes: Conjunctivae are normal. Sclera is non-icteric.       Mouth/Throat: Mucous membranes are moist.       Neck: Supple with no signs of meningismus. Cardiovascular: Regular rate and rhythm. No murmurs, gallops, or rubs. 2+ symmetrical distal pulses are present in all extremities. No JVD. Respiratory: Normal respiratory effort. Lungs are clear to auscultation bilaterally. No wheezes, crackles, or rhonchi.  Gastrointestinal: Soft, small umbilical hernia containing fat with no overlying skin changes mildly tender to palpation, non tender abdomen, and non distended with positive bowel sounds. No rebound or guarding. Musculoskeletal: Nontender with normal range of motion in all extremities. No edema, cyanosis, or erythema of extremities. Neurologic: Normal speech and language. Face is symmetric. Moving all extremities. No gross focal neurologic deficits are appreciated. Skin: Skin is warm, dry and intact. No rash noted. Psychiatric: Mood and affect are normal.  Speech and behavior are normal.  ____________________________________________   LABS (all labs ordered are listed, but only abnormal results are displayed)  Labs Reviewed - No data to display ____________________________________________  EKG  none  ____________________________________________  RADIOLOGY  I have personally reviewed the images performed during this visit and I agree with the Radiologist's read.   Interpretation by Radiologist:  Dg Abd 2 Views  Result Date: 08/03/2018 CLINICAL DATA:  Abdominal hernia near the navel with pain, history of Crohn's disease EXAM: ABDOMEN - 2 VIEW COMPARISON:  CT abdomen pelvis of 09/28/2016 FINDINGS: There is a moderate amount of feces throughout the entire colon. No bowel obstruction is seen. On one view there is minimal gaseous distention of a proximal small bowel loop of questionable significance. No definite small bowel edema is seen by plain film. No opaque calculi noted. The  bones are unremarkable. IMPRESSION: 1. Moderate amount of feces throughout the entire colon. No bowel obstruction. 2. No definite renal or ureteral calculi are seen. Electronically Signed   By: Ivar Drape M.D.   On: 08/03/2018 16:14     ____________________________________________   PROCEDURES  Procedure(s) performed:yes Hernia reduction Date/Time: 08/03/2018 5:44 PM Performed by: Rudene Re, MD Authorized by: Rudene Re, MD  Consent: Verbal consent obtained. Risks and benefits: risks, benefits and alternatives were discussed Consent given by: patient Patient understanding: patient states understanding of the procedure being performed Patient consent: the patient's understanding of the procedure matches consent given Procedure consent: procedure consent matches procedure scheduled Relevant documents: relevant documents present and verified Imaging studies: imaging studies available Patient identity confirmed: verbally with patient Time  out: Immediately prior to procedure a "time out" was called to verify the correct patient, procedure, equipment, support staff and site/side marked as required. Preparation: Patient was prepped and draped in the usual sterile fashion. Local anesthesia used: no  Anesthesia: Local anesthesia used: no  Sedation: Patient sedated: no  Patient tolerance: Patient tolerated the procedure well with no immediate complications Comments: Patient given morphine IM, ice applied for 30 min, hernia reduced with gentle pressure.        Critical Care performed:  None ____________________________________________   INITIAL IMPRESSION / ASSESSMENT AND PLAN / ED COURSE   37 y.o. male with history of Crohn's disease and hypertension who presents for evaluation of umbilical hernia.  Patient with a small umbilical hernia containing fat with no overlying skin changes, no signs of obstruction.  Hernia was reduced per procedure note above.  Patient is being referred to surgery for surgical repair.  Discussed standard return precautions with patient and his wife.      As part of my medical decision making, I reviewed the following data within the [facility] notes reviewed and incorporated, Old chart reviewed, Radiograph reviewed , Notes from prior ED visits and [place] Controlled Substance Database    Pertinent labs & imaging results that were available during my care of the patient were reviewed by me and considered in my medical decision making (see chart for details).    ____________________________________________   FINAL CLINICAL IMPRESSION(S) / ED DIAGNOSES  Final diagnoses:  Hernia of abdominal wall  Umbilical hernia without obstruction and without gangrene      NEW MEDICATIONS STARTED DURING THIS VISIT:  ED Discharge Orders    None       Note:  This document was prepared using Dragon voice recognition software and may include unintentional dictation errors.      Rudene Re, MD 08/03/18 [phone]

## 2018-08-03 NOTE — ED Notes (Signed)
Pt presentation discussed with EDP, [place].  See new orders.

## 2018-08-03 NOTE — ED Triage Notes (Signed)
Pt in via POV with complaints of bulging abdominal hernia around navel, w/ severe pain throughout the night.  Pt denies any N/V/D.  Vitals WDL.

## 2018-08-03 NOTE — ED Notes (Signed)
Pt states he did heavy lifting at the gym yesterday morning and then he works [employer] and by the afternoon noticed his umbilical hernia was bulging and painful. States he was not able to lay flat last night due to the pain, states it is a little better now, not as large as it was and less painful.

## 2018-11-10 ENCOUNTER — Observation Stay: Payer: Self-pay | Admitting: Anesthesiology

## 2018-11-10 ENCOUNTER — Other Ambulatory Visit: Payer: Self-pay

## 2018-11-10 ENCOUNTER — Encounter
Admission: EM | Disposition: A | Discharge status: Home / Self Care | Payer: Self-pay | Source: Home / Self Care | Attending: Emergency Medicine

## 2018-11-10 ENCOUNTER — Encounter: Payer: Self-pay | Admitting: Emergency Medicine

## 2018-11-10 ENCOUNTER — Observation Stay
Admission: EM | Admit: 2018-11-10 | Discharge: 2018-11-11 | Disposition: A | Discharge status: Home / Self Care | Payer: Self-pay | Attending: Surgery | Admitting: Surgery

## 2018-11-10 DIAGNOSIS — K420 Umbilical hernia with obstruction, without gangrene: Secondary | ICD-10-CM

## 2018-11-10 DIAGNOSIS — Z7951 Long term (current) use of inhaled steroids: Secondary | ICD-10-CM | POA: Insufficient documentation

## 2018-11-10 DIAGNOSIS — Z8249 Family history of ischemic heart disease and other diseases of the circulatory system: Secondary | ICD-10-CM | POA: Insufficient documentation

## 2018-11-10 DIAGNOSIS — K5090 Crohn's disease, unspecified, without complications: Secondary | ICD-10-CM | POA: Insufficient documentation

## 2018-11-10 DIAGNOSIS — K460 Unspecified abdominal hernia with obstruction, without gangrene: Secondary | ICD-10-CM

## 2018-11-10 DIAGNOSIS — Z79899 Other long term (current) drug therapy: Secondary | ICD-10-CM | POA: Insufficient documentation

## 2018-11-10 DIAGNOSIS — I10 Essential (primary) hypertension: Secondary | ICD-10-CM | POA: Insufficient documentation

## 2018-11-10 DIAGNOSIS — Z791 Long term (current) use of non-steroidal anti-inflammatories (NSAID): Secondary | ICD-10-CM | POA: Insufficient documentation

## 2018-11-10 HISTORY — PX: UMBILICAL HERNIA REPAIR: SHX196

## 2018-11-10 LAB — BASIC METABOLIC PANEL
Anion gap: 7 (ref 5–15)
BUN: 24 mg/dL — AB (ref 6–20)
CO2: 29 mmol/L (ref 22–32)
CREATININE: 1.19 mg/dL (ref 0.61–1.24)
Calcium: 8.7 mg/dL — ABNORMAL LOW (ref 8.9–10.3)
Chloride: 103 mmol/L (ref 98–111)
GFR calc Af Amer: >60 mL/min (ref >60)
GFR calc non Af Amer: >60 mL/min (ref >60)
Glucose, Bld: 77 mg/dL (ref 70–99)
Potassium: 4.5 mmol/L (ref 3.5–5.1)
SODIUM: 139 mmol/L (ref 135–145)

## 2018-11-10 LAB — LACTIC ACID, PLASMA: LACTIC ACID, VENOUS: 1 mmol/L (ref 0.5–1.9)

## 2018-11-10 LAB — CBC WITH DIFFERENTIAL/PLATELET
Abs Immature Granulocytes: 0.02 10*3/uL (ref 0.00–0.07)
BASOS PCT: 1 %
Basophils Absolute: 0 10*3/uL (ref 0.0–0.1)
Eosinophils Absolute: 0.4 10*3/uL (ref 0.0–0.5)
Eosinophils Relative: 6 %
HCT: 48.7 % (ref 39.0–52.0)
Hemoglobin: 16.3 g/dL (ref 13.0–17.0)
Immature Granulocytes: 0 %
Lymphocytes Relative: 25 %
Lymphs Abs: 1.6 10*3/uL (ref 0.7–4.0)
MCH: 29.7 pg (ref 26.0–34.0)
MCHC: 33.5 g/dL (ref 30.0–36.0)
MCV: 88.7 fL (ref 80.0–100.0)
MONO ABS: 0.6 10*3/uL (ref 0.1–1.0)
Monocytes Relative: 10 %
NEUTROS ABS: 3.8 10*3/uL (ref 1.7–7.7)
Neutrophils Relative %: 58 %
PLATELETS: 172 10*3/uL (ref 150–400)
RBC: 5.49 MIL/uL (ref 4.22–5.81)
RDW: 12.8 % (ref 11.5–15.5)
WBC: 6.5 10*3/uL (ref 4.0–10.5)
nRBC: 0 % (ref 0.0–0.2)

## 2018-11-10 SURGERY — REPAIR, HERNIA, UMBILICAL, ADULT
Anesthesia: General

## 2018-11-10 MED ORDER — ENOXAPARIN SODIUM 40 MG/0.4ML ~~LOC~~ SOLN
40.0000 mg | SUBCUTANEOUS | Status: DC
  Administered 2018-11-11: 40 mg via SUBCUTANEOUS
  Filled 2018-11-10: qty 0.4

## 2018-11-10 MED ORDER — ONDANSETRON HCL 4 MG/2ML IJ SOLN: 4.0000 mg | Freq: Four times a day (QID) | INTRAMUSCULAR | Status: DC | PRN

## 2018-11-10 MED ORDER — DEXAMETHASONE SODIUM PHOSPHATE 10 MG/ML IJ SOLN
INTRAMUSCULAR | Status: DC | PRN
  Administered 2018-11-10: 4 mg via INTRAVENOUS

## 2018-11-10 MED ORDER — PROPOFOL 10 MG/ML IV BOLUS
INTRAVENOUS | Status: DC | PRN
  Administered 2018-11-10: 200 mg via INTRAVENOUS

## 2018-11-10 MED ORDER — ACETAMINOPHEN 10 MG/ML IV SOLN
INTRAVENOUS | Status: DC | PRN
  Administered 2018-11-10: 1000 mg via INTRAVENOUS

## 2018-11-10 MED ORDER — SUCCINYLCHOLINE CHLORIDE 20 MG/ML IJ SOLN
INTRAMUSCULAR | Status: DC | PRN
  Administered 2018-11-10: 120 mg via INTRAVENOUS

## 2018-11-10 MED ORDER — MORPHINE SULFATE (PF) 4 MG/ML IV SOLN: 4.0000 mg | INTRAVENOUS | Status: DC | PRN

## 2018-11-10 MED ORDER — GLYCOPYRROLATE 0.2 MG/ML IJ SOLN
INTRAMUSCULAR | Status: DC | PRN
  Administered 2018-11-10: 0.2 mg via INTRAVENOUS

## 2018-11-10 MED ORDER — ROCURONIUM BROMIDE 100 MG/10ML IV SOLN
INTRAVENOUS | Status: DC | PRN
  Administered 2018-11-10: 5 mg via INTRAVENOUS
  Administered 2018-11-10: 25 mg via INTRAVENOUS
  Administered 2018-11-10: 10 mg via INTRAVENOUS

## 2018-11-10 MED ORDER — PANTOPRAZOLE SODIUM 40 MG IV SOLR
40.0000 mg | Freq: Every day | INTRAVENOUS | Status: DC
  Administered 2018-11-10: 40 mg via INTRAVENOUS
  Filled 2018-11-10: qty 40

## 2018-11-10 MED ORDER — FENTANYL CITRATE (PF) 100 MCG/2ML IJ SOLN
INTRAMUSCULAR | Status: DC | PRN
  Administered 2018-11-10: 100 ug via INTRAVENOUS
  Administered 2018-11-10: 50 ug via INTRAVENOUS

## 2018-11-10 MED ORDER — ONDANSETRON 4 MG PO TBDP: 4.0000 mg | ORAL_TABLET | Freq: Four times a day (QID) | ORAL | Status: DC | PRN

## 2018-11-10 MED ORDER — HYDROMORPHONE HCL 1 MG/ML IJ SOLN
0.5000 mg | INTRAMUSCULAR | Status: DC | PRN
  Administered 2018-11-10 – 2018-11-11 (×2): 0.5 mg via INTRAVENOUS
  Filled 2018-11-10 (×2): qty 0.5

## 2018-11-10 MED ORDER — KETOROLAC TROMETHAMINE 30 MG/ML IJ SOLN
INTRAMUSCULAR | Status: DC | PRN
  Administered 2018-11-10: 30 mg via INTRAVENOUS

## 2018-11-10 MED ORDER — SUGAMMADEX SODIUM 200 MG/2ML IV SOLN
INTRAVENOUS | Status: AC
  Filled 2018-11-10: qty 2

## 2018-11-10 MED ORDER — BUPIVACAINE LIPOSOME 1.3 % IJ SUSP
INTRAMUSCULAR | Status: DC | PRN
  Administered 2018-11-10: 20 mL

## 2018-11-10 MED ORDER — ACETAMINOPHEN 500 MG PO TABS
1000.0000 mg | ORAL_TABLET | Freq: Four times a day (QID) | ORAL | Status: DC
  Administered 2018-11-10 – 2018-11-11 (×2): 1000 mg via ORAL
  Filled 2018-11-10 (×3): qty 2

## 2018-11-10 MED ORDER — ONDANSETRON HCL 4 MG/2ML IJ SOLN
INTRAMUSCULAR | Status: DC | PRN
  Administered 2018-11-10: 4 mg via INTRAVENOUS

## 2018-11-10 MED ORDER — OXYCODONE HCL 5 MG PO TABS
5.0000 mg | ORAL_TABLET | ORAL | Status: DC | PRN
  Administered 2018-11-11: 10 mg via ORAL
  Filled 2018-11-10: qty 2

## 2018-11-10 MED ORDER — LACTATED RINGERS IV SOLN
125.0000 mL/h | INTRAVENOUS | Status: DC
  Administered 2018-11-10 – 2018-11-11 (×2): 125 mL/h via INTRAVENOUS

## 2018-11-10 MED ORDER — KETOROLAC TROMETHAMINE 30 MG/ML IJ SOLN
INTRAMUSCULAR | Status: AC
  Filled 2018-11-10: qty 1

## 2018-11-10 MED ORDER — HYDROMORPHONE HCL 1 MG/ML IJ SOLN
0.5000 mg | INTRAMUSCULAR | Status: AC
  Administered 2018-11-10: 0.5 mg via INTRAVENOUS
  Filled 2018-11-10: qty 1

## 2018-11-10 MED ORDER — FENTANYL CITRATE (PF) 100 MCG/2ML IJ SOLN: 25.0000 ug | INTRAMUSCULAR | Status: DC | PRN

## 2018-11-10 MED ORDER — METHADONE HCL 10 MG/ML PO CONC
135.0000 mg | Freq: Every day | ORAL | Status: DC
  Administered 2018-11-11: 135 mg via ORAL
  Filled 2018-11-10: qty 13.5

## 2018-11-10 MED ORDER — MIDAZOLAM HCL 2 MG/2ML IJ SOLN
INTRAMUSCULAR | Status: AC
  Filled 2018-11-10: qty 2

## 2018-11-10 MED ORDER — LACTATED RINGERS IV SOLN
INTRAVENOUS | Status: DC
  Administered 2018-11-10: 17:00:00 via INTRAVENOUS

## 2018-11-10 MED ORDER — BUPIVACAINE-EPINEPHRINE (PF) 0.5% -1:200000 IJ SOLN
INTRAMUSCULAR | Status: DC | PRN
  Administered 2018-11-10: 20 mL via PERINEURAL

## 2018-11-10 MED ORDER — PROPOFOL 10 MG/ML IV BOLUS
INTRAVENOUS | Status: AC
  Filled 2018-11-10: qty 20

## 2018-11-10 MED ORDER — LIDOCAINE HCL (CARDIAC) PF 100 MG/5ML IV SOSY
PREFILLED_SYRINGE | INTRAVENOUS | Status: DC | PRN
  Administered 2018-11-10: 50 mg via INTRAVENOUS

## 2018-11-10 MED ORDER — FENTANYL CITRATE (PF) 250 MCG/5ML IJ SOLN
INTRAMUSCULAR | Status: AC
  Filled 2018-11-10: qty 5

## 2018-11-10 MED ORDER — MIDAZOLAM HCL 2 MG/2ML IJ SOLN
INTRAMUSCULAR | Status: DC | PRN
  Administered 2018-11-10: 2 mg via INTRAVENOUS

## 2018-11-10 MED ORDER — KETOROLAC TROMETHAMINE 30 MG/ML IJ SOLN
30.0000 mg | Freq: Four times a day (QID) | INTRAMUSCULAR | Status: DC
  Administered 2018-11-10 – 2018-11-11 (×3): 30 mg via INTRAVENOUS
  Filled 2018-11-10 (×3): qty 1

## 2018-11-10 MED ORDER — POLYETHYLENE GLYCOL 3350 17 G PO PACK: 17.0000 g | PACK | Freq: Every day | ORAL | Status: DC | PRN

## 2018-11-10 MED ORDER — ONDANSETRON HCL 4 MG/2ML IJ SOLN: 4.0000 mg | Freq: Once | INTRAMUSCULAR | Status: DC | PRN

## 2018-11-10 MED ORDER — PIPERACILLIN-TAZOBACTAM 3.375 G IVPB 30 MIN
3.3750 g | Freq: Once | INTRAVENOUS | Status: AC
  Administered 2018-11-10: 3.375 g via INTRAVENOUS
  Filled 2018-11-10: qty 50

## 2018-11-10 MED ORDER — SUGAMMADEX SODIUM 200 MG/2ML IV SOLN
INTRAVENOUS | Status: DC | PRN
  Administered 2018-11-10: 200 mg via INTRAVENOUS

## 2018-11-10 MED ORDER — METHADONE HCL 10 MG/ML PO CONC
135.0000 mg | Freq: Every day | ORAL | Status: DC
  Filled 2018-11-10: qty 13.5

## 2018-11-10 MED ORDER — METOPROLOL TARTRATE 5 MG/5ML IV SOLN: 5.0000 mg | Freq: Four times a day (QID) | INTRAVENOUS | Status: DC | PRN

## 2018-11-10 MED ORDER — OXYCODONE-ACETAMINOPHEN 5-325 MG PO TABS
1.0000 | ORAL_TABLET | Freq: Once | ORAL | Status: AC
  Administered 2018-11-10: 1 via ORAL
  Filled 2018-11-10: qty 1

## 2018-11-10 SURGICAL SUPPLY — 31 items
BLADE SURG 15 STRL LF DISP TIS (BLADE) ×1 IMPLANT
BLADE SURG 15 STRL SS (BLADE) ×1
CANISTER SUCT 1200ML W/VALVE (MISCELLANEOUS) ×2 IMPLANT
CHLORAPREP W/TINT 26ML (MISCELLANEOUS) ×2 IMPLANT
COVER WAND RF STERILE (DRAPES) ×2 IMPLANT
DERMABOND ADVANCED (GAUZE/BANDAGES/DRESSINGS) ×1
DERMABOND ADVANCED .7 DNX12 (GAUZE/BANDAGES/DRESSINGS) ×1 IMPLANT
DRAPE LAPAROTOMY 77X122 PED (DRAPES) ×2 IMPLANT
ELECT REM PT RETURN 9FT ADLT (ELECTROSURGICAL) ×2
ELECTRODE REM PT RTRN 9FT ADLT (ELECTROSURGICAL) ×1 IMPLANT
GLOVE SURG SYN 7.0 (GLOVE) ×2 IMPLANT
GLOVE SURG SYN 7.5  E (GLOVE) ×1
GLOVE SURG SYN 7.5 E (GLOVE) ×1 IMPLANT
GOWN STRL REUS W/ TWL LRG LVL3 (GOWN DISPOSABLE) ×4 IMPLANT
GOWN STRL REUS W/TWL LRG LVL3 (GOWN DISPOSABLE) ×4
HOLDER FOLEY CATH W/STRAP (MISCELLANEOUS) ×2 IMPLANT
NEEDLE FILTER BLUNT 18X 1/2SAF (NEEDLE) ×1
NEEDLE FILTER BLUNT 18X1 1/2 (NEEDLE) ×1 IMPLANT
NEEDLE HYPO 22GX1.5 SAFETY (NEEDLE) ×2 IMPLANT
NS IRRIG 500ML POUR BTL (IV SOLUTION) ×2 IMPLANT
PACK BASIN MINOR ARMC (MISCELLANEOUS) ×2 IMPLANT
SUT ETHIBOND 0 MO6 C/R (SUTURE) ×2 IMPLANT
SUT MNCRL AB 4-0 PS2 18 (SUTURE) ×2 IMPLANT
SUT VIC AB 2-0 SH 27 (SUTURE) ×1
SUT VIC AB 2-0 SH 27XBRD (SUTURE) ×1 IMPLANT
SUT VIC AB 3-0 SH 27 (SUTURE) ×1
SUT VIC AB 3-0 SH 27X BRD (SUTURE) ×1 IMPLANT
SYR 20CC LL (SYRINGE) ×2 IMPLANT
SYR BULB IRRIG 60ML STRL (SYRINGE) ×2 IMPLANT
SYR TB 1ML 27GX1/2 LL (SYRINGE) ×2 IMPLANT
TRAY FOLEY SLVR 16FR LF STAT (SET/KITS/TRAYS/PACK) ×2 IMPLANT

## 2018-11-10 NOTE — ED Notes (Signed)
Per PT last meal today 11/10/2018 at 7am.

## 2018-11-10 NOTE — Anesthesia Post-op Follow-up Note (Signed)
Anesthesia QCDR form completed.        

## 2018-11-10 NOTE — ED Notes (Signed)
MD at bedside. 

## 2018-11-10 NOTE — ED Notes (Signed)
Wife took patients belongings home. Underwear at bedside with patient

## 2018-11-10 NOTE — Anesthesia Procedure Notes (Signed)
Procedure Name: Intubation Performed by: Rolla Plate, CRNA Pre-anesthesia Checklist: Patient identified, Patient being monitored, Timeout performed, Emergency Drugs available and Suction available Patient Re-evaluated:Patient Re-evaluated prior to induction Oxygen Delivery Method: Circle system utilized Preoxygenation: Pre-oxygenation with 100% oxygen Induction Type: IV induction and Rapid sequence Laryngoscope Size: Miller and 2 Grade View: Grade II Tube type: Oral Tube size: 7.5 mm Number of attempts: 1 Airway Equipment and Method: Stylet Placement Confirmation: ETT inserted through vocal cords under direct vision,  positive ETCO2 and breath sounds checked- equal and bilateral Secured at: 23 cm Tube secured with: Tape Dental Injury: Teeth and Oropharynx as per pre-operative assessment

## 2018-11-10 NOTE — ED Notes (Signed)
Patient given pink denture cup for false tooth. At bedside with patient

## 2018-11-10 NOTE — ED Notes (Signed)
Orderly present to take patient to OR

## 2018-11-10 NOTE — ED Triage Notes (Signed)
Patient reports umbilical hernia that "popped out" this morning at work. States this has before but it is usually able to be able to be pushed back in or it goes in on its own.

## 2018-11-10 NOTE — ED Provider Notes (Signed)
[facility] Emergency Department Provider Note   ____________________________________________   First MD Initiated Contact with Patient 11/10/18 1508     (approximate)  I have reviewed the triage vital signs and the nursing notes.   HISTORY  Chief Complaint Hernia    HPI Tommy Gomez is a 38 y.o. male here for evaluation of "hernia pain"  Patient reports he started having a lot of pain in his hernia site this morning.  It comes and goes off and on exam to have it put back in a couple of times in the past.  Reports has been having pretty severe constant pain there since this morning and he was unable to push it back in  No nausea vomiting.  Reports the pain is severe 10 out of 10 even after taking Percocet.  Has not anything to eat or drink since early this morning.  Patient is sharp, worse if he pushes on the area.  The hernia is around his bellybutton is very swollen   Past Medical History:  Diagnosis Date  . Crohn disease ([place])   . Hypertension     Patient Active Problem List   Diagnosis Date Noted  . Acute pancreatitis 09/28/2016    Past Surgical History:  Procedure Laterality Date  . FACIAL FRACTURE SURGERY    . FASCIOTOMY      Prior to Admission medications   Medication Sig Start Date End Date Taking? Authorizing Provider  albuterol (PROVENTIL HFA;VENTOLIN HFA) 108 (90 Base) MCG/ACT inhaler Inhale 2 puffs into the lungs every 4 (four) hours as needed for wheezing or shortness of breath. Patient not taking: Reported on 09/28/2016 08/24/16   Cuthriell, Charline Bills, PA-C  brompheniramine-pseudoephedrine-DM 30-2-10 MG/5ML syrup Take 5 mLs by mouth 4 (four) times daily as needed. 12/15/16   Sable Feil, PA-C  cetirizine (ZYRTEC) 10 MG tablet Take 1 tablet (10 mg total) by mouth daily. Patient not taking: Reported on 09/28/2016 08/24/16   Cuthriell, Charline Bills, PA-C  cyclobenzaprine (FLEXERIL) 10 MG tablet Take 1 tablet (10 mg total) by  mouth 3 (three) times daily as needed. 12/15/16   Sable Feil, PA-C  dicyclomine (BENTYL) 20 MG tablet Take 1 tablet (20 mg total) by mouth 3 (three) times daily as needed (abdominal pain). Patient not taking: Reported on 09/28/2016 10/08/15 10/07/16  Nance Pear, MD  famotidine (PEPCID) 20 MG tablet Take 1 tablet (20 mg total) by mouth daily. 04/26/16 09/28/17  Hagler, Jami L, PA-C  fluticasone (FLONASE) 50 MCG/ACT nasal spray Place 1 spray into both nostrils 2 (two) times daily. Patient not taking: Reported on 09/28/2016 08/24/16   Cuthriell, Charline Bills, PA-C  ibuprofen (ADVIL,MOTRIN) 200 MG tablet Take 200 mg by mouth every 6 (six) hours as needed.    [provider]  ibuprofen (ADVIL,MOTRIN) 600 MG tablet Take 1 tablet (600 mg total) by mouth every 8 (eight) hours as needed. 12/15/16   Sable Feil, PA-C  loperamide (IMODIUM) 2 MG capsule Take 1 capsule (2 mg total) by mouth 4 (four) times daily as needed for diarrhea or loose stools. 10/01/16   [name], MD  methadone (DOLOPHINE) 10 MG/ML solution Take 10 mLs (100 mg total) by mouth daily. 10/01/16   [name], MD  ondansetron (ZOFRAN) 4 MG tablet Take 1 tablet (4 mg total) by mouth every 8 (eight) hours as needed. 10/08/15   Nance Pear, MD  traMADol (ULTRAM) 50 MG tablet Take 1 tablet (50 mg total) by mouth every 6 (  six) hours as needed for moderate pain. 12/15/16   Sable Feil, PA-C    Allergies Patient has no known allergies.  Family History  Problem Relation Age of Onset  . Hypertension Father     Social History Social History   Tobacco Use  . Smoking status: Never Smoker  . Smokeless tobacco: Current User  Substance Use Topics  . Alcohol use: Yes    Comment: occasional  . Drug use: No    Review of Systems Constitutional: No fever/chills Eyes: No visual changes. ENT: No sore throat. Cardiovascular: Denies chest pain. Respiratory: Denies shortness of breath. Gastrointestinal: See HPI  genitourinary:  Negative for dysuria. Musculoskeletal: Negative for back pain. Skin: Negative for rash. Neurological: Negative for headaches, areas of focal weakness or numbness.    ____________________________________________   PHYSICAL EXAM:  VITAL SIGNS: ED Triage Vitals  Enc Vitals Group     BP 11/10/18 1024 (!) 143/55     Pulse Rate 11/10/18 1024 64     Resp 11/10/18 1024 20     Temp 11/10/18 1024 98 F (36.7 C)     Temp Source 11/10/18 1024 Oral     SpO2 11/10/18 1024 98 %     Weight 11/10/18 1024 217 lb (98.4 kg)     Height 11/10/18 1024 6' 1"  (1.854 m)     Head Circumference --      Peak Flow --      Pain Score 11/10/18 1030 10     Pain Loc --      Pain Edu? --      Excl. in [place]? --     Constitutional: Alert and oriented. Well appearing and in no acute distress. Eyes: Conjunctivae are normal. Head: Atraumatic. Nose: No congestion/rhinnorhea. Mouth/Throat: Mucous membranes are moist. Neck: No stridor.  Cardiovascular: Normal rate, regular rhythm. Grossly normal heart sounds.  Good peripheral circulation. Respiratory: Normal respiratory effort.  No retractions. Lungs CTAB. Gastrointestinal: Soft and nontender with exception to notable tenderness and a solid about 2 cm ovoid shaped periumbilical mass consistent with a hernia.  It is rather tender to palpation but does not have overlying erythema.  Appears consistent with a probable incarcerated hernia. Musculoskeletal: No lower extremity tenderness nor edema. Neurologic:  Normal speech and language. No gross focal neurologic deficits are appreciated.  Skin:  Skin is warm, dry and intact. No rash noted. Psychiatric: Mood and affect are normal. Speech and behavior are normal.  ____________________________________________   LABS (all labs ordered are listed, but only abnormal results are displayed)  Labs Reviewed  BASIC METABOLIC PANEL - Abnormal; Notable for the following components:      Result Value   BUN 24 (*)    Calcium  8.7 (*)    All other components within normal limits  CBC WITH DIFFERENTIAL/PLATELET  LACTIC ACID, PLASMA   ____________________________________________  EKG   ____________________________________________  RADIOLOGY   ____________________________________________   PROCEDURES  Procedure(s) performed: hernia reducation  Hernia reduction Date/Time: 11/10/2018 3:45 PM Performed by: Delman Kitten, MD Authorized by: Delman Kitten, MD  Unsuccessful attempt Consent: The procedure was performed in an emergent situation. Verbal consent obtained. Written consent not obtained. Risks and benefits: risks, benefits and alternatives were discussed Consent given by: patient Patient identity confirmed: verbally with patient Local anesthesia used: no  Anesthesia: Local anesthesia used: no  Sedation: Patient sedated: no  Patient tolerance: Patient tolerated the procedure well with no immediate complications Comments: Unable to reduce at bedside.      Critical  Care performed: No  ____________________________________________   INITIAL IMPRESSION / ASSESSMENT AND PLAN / ED COURSE  Pertinent labs & imaging results that were available during my care of the patient were reviewed by me and considered in my medical decision making (see chart for details).   Patient presents for focal abdominal pain.  Periumbilical on examination he has a concerning exam for a possible incarcerated hernia.  This was not able to be reduced at the bedside after pain medication, and I immediately contacted general surgery.  General surgery evaluated the patient as well, and decision taken to the OR for further evaluation.  Clinical Course as of Nov 10 1552  Thu Nov 10, 2018  1538 Consult has been placed with Dr. Hampton Abbot   [MQ]    Clinical Course User Index [MQ] Delman Kitten, MD     ____________________________________________   FINAL CLINICAL IMPRESSION(S) / ED DIAGNOSES  Final diagnoses:    Incarcerated hernia        Note:  This document was prepared using Dragon voice recognition software and may include unintentional dictation errors       Delman Kitten, MD 11/12/18 2125

## 2018-11-10 NOTE — Anesthesia Preprocedure Evaluation (Signed)
Anesthesia Evaluation  Patient identified by MRN, date of birth, ID band Patient awake    Reviewed: Allergy & Precautions, NPO status , Patient's Chart, lab work & pertinent test results  History of Anesthesia Complications Negative for: history of anesthetic complications  Airway Mallampati: II       Dental  (+) Partial Upper   Pulmonary neg sleep apnea, neg COPD,           Cardiovascular hypertension (no meds after weight loss), (-) Past MI and (-) CHF (-) dysrhythmias (-) Valvular Problems/Murmurs     Neuro/Psych neg Seizures    GI/Hepatic Neg liver ROS, neg GERD  ,  Endo/Other  neg diabetes  Renal/GU negative Renal ROS     Musculoskeletal   Abdominal   Peds  Hematology   Anesthesia Other Findings   Reproductive/Obstetrics                             Anesthesia Physical Anesthesia Plan  ASA: II and emergent  Anesthesia Plan: General   Post-op Pain Management:    Induction: Intravenous  PONV Risk Score and Plan: 2 and Dexamethasone and Ondansetron  Airway Management Planned: Oral ETT  Additional Equipment:   Intra-op Plan:   Post-operative Plan:   Informed Consent: I have reviewed the patients History and Physical, chart, labs and discussed the procedure including the risks, benefits and alternatives for the proposed anesthesia with the patient or authorized representative who has indicated his/her understanding and acceptance.       Plan Discussed with:   Anesthesia Plan Comments:         Anesthesia Quick Evaluation

## 2018-11-10 NOTE — Op Note (Signed)
Procedure Date:  11/10/2018  Pre-operative Diagnosis:  Strangulated umbilical hernia  Post-operative Diagnosis:  Strangulated umbilical hernia  Procedure:  Umbilical hernia repair  Surgeon:  Melvyn Neth, MD  Assistant:  Edison Simon, PA-C  Anesthesia:  General endotracheal  Estimated Blood Loss:  10 ml  Specimens:  None  Complications:  None  Indications for Procedure:  This is a 38 y.o. male who presents with a strangulated umbilical hernia.  The risks of bleeding, abscess or infection, injury to surrounding structures, and need for further procedures were all discussed with the patient and was willing to proceed.  Description of Procedure: The patient was correctly identified in the preoperative area and brought into the operating room.  The patient was placed supine with VTE prophylaxis in place.  Appropriate time-outs were performed.  Anesthesia was induced and the patient was intubated.  Appropriate antibiotics were infused.  Foley catheter was placed.  The abdomen was prepped and draped in a sterile fashion.  A left lateral periumbilical incision was made and cautery was used to dissect down the subcutaneous tissue to reach the hernia contents.  Careful dissection was then performed to separate the hernia sac from surrounding tissue.  The sac was entered revealing small knuckle of small bowel and fat.  Bowel appeared healthy.  The hernia sac was mobilized off the contents and surrounding fascia and resected.  The hernia contents were then reduced into the abdominal cavity.  The fascial edges were cleaned using cautery.  The fascia was then closed with 0 Ethibond sutures. The umbilical stalk was then reattached to the fascia using 2-0 Vicryl. The wound was irrigated and 40 ml of Exparel with Bupivacaine with epi was infused.  The wound was then closed in layers using 3-0 Vicryl and 4-0 Monocryl. The incision was cleaned and sealed with DermaBond.  Foley catheter was removed.   The patient was emerged from anesthesia and extubated and brought to the recovery room for further management.  The patient tolerated the procedure well and all counts were correct at the end of the case.   Melvyn Neth, MD

## 2018-11-10 NOTE — Transfer of Care (Signed)
Immediate Anesthesia Transfer of Care Note  Patient: Tommy Gomez  Procedure(s) Performed: HERNIA REPAIR UMBILICAL ADULT (N/A )  Patient Location: PACU  Anesthesia Type:General  Level of Consciousness: sedated  Airway & Oxygen Therapy: Patient Spontanous Breathing and Patient connected to face mask oxygen  Post-op Assessment: Report given to RN and Post -op Vital signs reviewed and stable  Post vital signs: Reviewed  Last Vitals:  Vitals Value Taken Time  BP 98/44 11/10/2018  6:44 PM  Temp    Pulse 50 11/10/2018  6:45 PM  Resp 12 11/10/2018  6:45 PM  SpO2 97 % 11/10/2018  6:45 PM  Vitals shown include unvalidated device data.  Last Pain:  Vitals:   11/10/18 1641  TempSrc:   PainSc: 9          Complications: No apparent anesthesia complications

## 2018-11-10 NOTE — H&P (Addendum)
[facility] SURGICAL HISTORY & PHYSICAL (cpt [phone])  HISTORY OF PRESENT ILLNESS (HPI):  38 y.o. male presented to [facility] ED today for evaluation of abdominal pain. Patient reports that he has a history of umbilical hernia and he noticed significant pain near that site while he was at work. 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 The hernia has been previously reducible about 6 times in the past which he was able to manually or spontaneously reduce within a few hours. He denied any fevers, chills, nausea, or emesis. No other associated complaints. He does endorse ongoing bowel function. No previous abdominal surgeries. Work up in the ED was concerning for umbilical hernia.   Surgery is consulted by emergency medicine physician Dr. Delman Kitten, MD in this context for evaluation and management of his umbilical hernia.   PAST MEDICAL HISTORY (PMH):  Past Medical History:  Diagnosis Date  . Crohn disease ([place])   . Hypertension     Reviewed. Otherwise negative.   PAST SURGICAL HISTORY ([place]):  Past Surgical History:  Procedure Laterality Date  . FACIAL FRACTURE SURGERY    . FASCIOTOMY      Reviewed. Otherwise negative.   MEDICATIONS:  Prior to Admission medications   Medication Sig Start Date End Date Taking? Authorizing Provider  albuterol (PROVENTIL HFA;VENTOLIN HFA) 108 (90 Base) MCG/ACT inhaler Inhale 2 puffs into the lungs every 4 (four) hours as needed for wheezing or shortness of breath. Patient not taking: Reported on 09/28/2016 08/24/16   Cuthriell, Charline Bills, PA-C  brompheniramine-pseudoephedrine-DM 30-2-10 MG/5ML syrup Take 5 mLs by mouth 4 (four) times daily as needed. 12/15/16   Sable Feil, PA-C  cetirizine (ZYRTEC) 10 MG tablet Take 1 tablet (10 mg total) by mouth daily. Patient not taking: Reported on 09/28/2016 08/24/16   Cuthriell, Charline Bills, PA-C  cyclobenzaprine (FLEXERIL) 10 MG tablet Take 1 tablet  (10 mg total) by mouth 3 (three) times daily as needed. 12/15/16   Sable Feil, PA-C  dicyclomine (BENTYL) 20 MG tablet Take 1 tablet (20 mg total) by mouth 3 (three) times daily as needed (abdominal pain). Patient not taking: Reported on 09/28/2016 10/08/15 10/07/16  Nance Pear, MD  famotidine (PEPCID) 20 MG tablet Take 1 tablet (20 mg total) by mouth daily. 04/26/16 09/28/17  Hagler, Jami L, PA-C  fluticasone (FLONASE) 50 MCG/ACT nasal spray Place 1 spray into both nostrils 2 (two) times daily. Patient not taking: Reported on 09/28/2016 08/24/16   Cuthriell, Charline Bills, PA-C  ibuprofen (ADVIL,MOTRIN) 200 MG tablet Take 200 mg by mouth every 6 (six) hours as needed.    [provider]  ibuprofen (ADVIL,MOTRIN) 600 MG tablet Take 1 tablet (600 mg total) by mouth every 8 (eight) hours as needed. 12/15/16   Sable Feil, PA-C  loperamide (IMODIUM) 2 MG capsule Take 1 capsule (2 mg total) by mouth 4 (four) times daily as needed for diarrhea or loose stools. 10/01/16   [name], MD  methadone (DOLOPHINE) 10 MG/ML solution Take 10 mLs (100 mg total) by mouth daily. 10/01/16   [name], MD  ondansetron (ZOFRAN) 4 MG tablet Take 1 tablet (4 mg total) by mouth every 8 (eight) hours as needed. 10/08/15   Nance Pear, MD  traMADol (ULTRAM) 50 MG tablet Take 1 tablet (50 mg total) by mouth every 6 (six) hours as needed for moderate pain. 12/15/16   Sable Feil, PA-C  ALLERGIES:  No Known Allergies   SOCIAL HISTORY:  Social History   Socioeconomic History  . Marital status: Married    Spouse name: Not on file  . Number of children: Not on file  . Years of education: Not on file  . Highest education level: Not on file  Occupational History  . Not on file  Social Needs  . Financial resource strain: Not on file  . Food insecurity:    Worry: Not on file    Inability: Not on file  . Transportation needs:    Medical: Not on file    Non-medical: Not on file  Tobacco Use  .  Smoking status: Never Smoker  . Smokeless tobacco: Current User  Substance and Sexual Activity  . Alcohol use: Yes    Comment: occasional  . Drug use: No  . Sexual activity: Not on file  Lifestyle  . Physical activity:    Days per week: Not on file    Minutes per session: Not on file  . Stress: Not on file  Relationships  . Social connections:    Talks on phone: Not on file    Gets together: Not on file    Attends religious service: Not on file    Active member of club or organization: Not on file    Attends meetings of clubs or organizations: Not on file    Relationship status: Not on file  . Intimate partner violence:    Fear of current or ex partner: Not on file    Emotionally abused: Not on file    Physically abused: Not on file    Forced sexual activity: Not on file  Other Topics Concern  . Not on file  Social History Narrative  . Not on file     FAMILY HISTORY:  Family History  Problem Relation Age of Onset  . Hypertension Father     Otherwise negative.   REVIEW OF SYSTEMS:  Review of Systems  Constitutional: Negative for chills and fever.  Respiratory: Negative for cough and shortness of breath.   Cardiovascular: Negative for chest pain and palpitations.  Gastrointestinal: Positive for abdominal pain and nausea. Negative for constipation, diarrhea and vomiting.  Genitourinary: Negative for dysuria and urgency.  Neurological: Negative for dizziness and headaches.  Psychiatric/Behavioral: Positive for depression.  All other systems reviewed and are negative.   VITAL SIGNS:  Temp:  [98 F (36.7 C)-98.2 F (36.8 C)] 98.2 F (36.8 C) (02/27 1250) Pulse Rate:  [64-67] 67 (02/27 1538) Resp:  [18-20] 18 (02/27 1250) BP: (143-148)/(55-64) 148/64 (02/27 1250) SpO2:  [98 %] 98 % (02/27 1538) Weight:  [98.4 kg] 98.4 kg (02/27 1024)     Height: 6' 1"  (185.4 cm) Weight: 98.4 kg BMI (Calculated): 28.64   PHYSICAL EXAM:  Physical Exam Constitutional:       General: He is not in acute distress.    Appearance: Normal appearance. He is not ill-appearing.  HENT:     Head: Normocephalic and atraumatic.  Eyes:     General: No scleral icterus.    Conjunctiva/sclera: Conjunctivae normal.  Cardiovascular:     Rate and Rhythm: Normal rate and regular rhythm.     Pulses: Normal pulses.     Heart sounds: No murmur. No friction rub. No gallop.   Pulmonary:     Effort: Pulmonary effort is normal. No respiratory distress.     Breath sounds: Normal breath sounds. No wheezing or rhonchi.  Abdominal:     General:  There is no distension.     Tenderness: There is abdominal tenderness. There is no guarding or rebound.     Hernia: A hernia is present. Hernia is present in the umbilical area.     Comments: Umbilical hernia, not able to tolerate manual reduction attempts  Genitourinary:    Comments: Deferred Musculoskeletal:     Right lower leg: No edema.     Left lower leg: No edema.  Skin:    General: Skin is warm and dry.     Coloration: Skin is not pale.     Findings: No erythema.  Neurological:     General: No focal deficit present.     Mental Status: He is alert. He is disoriented.  Psychiatric:        Mood and Affect: Mood normal.        Behavior: Behavior normal.     INTAKE/OUTPUT:  This shift: No intake/output data recorded.  Last 2 shifts: @IOLAST2SHIFTS @  Labs:  CBC Latest Ref Rng & Units 11/10/2018 09/29/2016 09/28/2016  WBC 4.0 - 10.5 K/uL 6.5 5.9 6.9  Hemoglobin 13.0 - 17.0 g/dL 16.3 14.7 18.6(H)  Hematocrit 39.0 - 52.0 % 48.7 41.5 51.6  Platelets 150 - 400 K/uL 172 112(L) 185   CMP Latest Ref Rng & Units 11/10/2018 09/30/2016 09/28/2016  Glucose 70 - 99 mg/dL 77 91 104(H)  BUN 6 - 20 mg/dL 24(H) 9 16  Creatinine 0.61 - 1.24 mg/dL 1.19 1.06 1.28(H)  Sodium 135 - 145 mmol/L 139 138 136  Potassium 3.5 - 5.1 mmol/L 4.5 3.8 3.1(L)  Chloride 98 - 111 mmol/L 103 110 103  CO2 22 - 32 mmol/L 29 21(L) 21(L)  Calcium 8.9 - 10.3 mg/dL  8.7(L) 7.6(L) 7.8(L)  Total Protein 6.5 - 8.1 g/dL - 5.7(L) 6.8  Total Bilirubin 0.3 - 1.2 mg/dL - 2.1(H) 2.5(H)  Alkaline Phos 38 - 126 U/L - 87 114  AST 15 - 41 U/L - 175(H) 156(H)  ALT 17 - 63 U/L - 102(H) 110(H)     Assessment/Plan: (ICD-10's: K42.9) 38 y.o. male with incarcerated umbilical hernia which he is unable to tolerate manual reduction in the ED, complicated by pertinent comorbidities including HTN and a history of Crohn's Disease.    - Admit to general surgery  - NPO, IVF, IV ABx (Zosyn - surgical prophylaxis)   - Will plan on umbilical hernia repair in the OR with Dr Hampton Abbot pending OR/Anesthesia availability  - All risks, benefits, and alternatives to above procedure(s) were discussed with the patient and her family, all of their questions were answered to their expressed satisfaction, patient expresses he wishes to proceed, and informed consent was obtained.  - Medical management of comorbidities  All of the above findings and recommendations were discussed with the patient and his family, and all of his and family's questions were answered to their expressed satisfaction.  -- Edison Simon, PA-C [facility] 11/10/2018, 4:21 PM [phone] M-F: 7am - 4pm

## 2018-11-10 NOTE — Anesthesia Postprocedure Evaluation (Signed)
Anesthesia Post Note  Patient: Tommy Gomez  Procedure(s) Performed: HERNIA REPAIR UMBILICAL ADULT (N/A )  Patient location during evaluation: PACU Anesthesia Type: General Level of consciousness: awake and alert Pain management: pain level controlled Vital Signs Assessment: post-procedure vital signs reviewed and stable Respiratory status: spontaneous breathing and respiratory function stable Cardiovascular status: stable Anesthetic complications: no     Last Vitals:  Vitals:   11/10/18 1934 11/10/18 2116  BP: 105/61 (!) 117/43  Pulse: (!) 54 (!) 50  Resp: 20 18  Temp: 36.5 C (!) 36.3 C  SpO2: 100% 97%    Last Pain:  Vitals:   11/10/18 2116  TempSrc: Oral  PainSc:                  [name],[name] K

## 2018-11-10 NOTE — ED Notes (Signed)
EDP at bedside  

## 2018-11-11 ENCOUNTER — Encounter: Payer: Self-pay | Admitting: Surgery

## 2018-11-11 ENCOUNTER — Other Ambulatory Visit: Payer: Self-pay | Admitting: Physician Assistant

## 2018-11-11 MED ORDER — OXYCODONE HCL 5 MG PO TABS: 5.0000 mg | ORAL_TABLET | Freq: Four times a day (QID) | ORAL | 0 refills | Status: DC | PRN

## 2018-11-11 MED ORDER — OXYCODONE HCL 5 MG PO TABS: 5.0000 mg | ORAL_TABLET | Freq: Four times a day (QID) | ORAL | 0 refills | Status: AC | PRN

## 2018-11-11 MED ORDER — IBUPROFEN 800 MG PO TABS: 800.0000 mg | ORAL_TABLET | Freq: Three times a day (TID) | ORAL | 0 refills | Status: AC | PRN

## 2018-11-11 NOTE — Progress Notes (Signed)
Patient cleared for discharge.   Education complete. AVS printed. Discharge instructions given. All questions answered for patient clarification.  Prescriptions given, pharmacy verified.  IV removed.  Discharged to home via POV

## 2018-11-11 NOTE — Progress Notes (Signed)
Received call from patients pharmacy that they did not fill the patient's prescription because they do not carry that medication  Will re-send to the new pharmacy provided by patient.   Edison Simon, PA-C [facility] 11/11/2018, 4:53 PM [phone] M-F: 7am - 4pm

## 2018-11-11 NOTE — Discharge Summary (Signed)
[facility] SURGICAL DISCHARGE SUMMARY  Patient ID: Tommy Gomez MRN: [id number] DOB/AGE: November 16, 1980 38 y.o.  Admit date: 11/10/2018 Discharge date: 11/11/2018  Discharge Diagnoses Strangulated Umbilical Hernia  Consultants None  Procedures 93/81/8299:  Umbilical Hernia Repair  HPI: 38 y.o. male presented to [facility] ED today for evaluation of abdominal pain. Patient reports that he has a history of umbilical hernia and he noticed significant pain near that site while he was at work. 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 The hernia has been previously reducible about 6 times in the past which he was able to manually or spontaneously reduce within a few hours. He denied any fevers, chills, nausea, or emesis. No other associated complaints. He does endorse ongoing bowel function. No previous abdominal surgeries. Work up in the ED was concerning for strangulated umbilical hernia.   Hospital Course: Informed consent was obtained and documented, and patient underwent uneventful umbilical hernia repair (Dr Hampton Abbot, 11/10/2018).  Post-operatively, patient's pain improved/resolved and advancement of patient's diet and ambulation were well-tolerated. The remainder of patient's hospital course was essentially unremarkable, and discharge planning was initiated accordingly with patient safely able to be discharged home with appropriate discharge instructions, pain control, and outpatient follow-up after all of his questions were answered to his expressed satisfaction.  Discharge Condition: Good   Physical Examination:  Constitutional: Well appearing male, NAD Pulmonary: Normal Effort, No respiratory distress Gastrointestinal: Soft, non-tender, non-distended, no umbilical hernia recurrence Skin: Umbilical incision is CDI, no erythema or drainage   Allergies as of 11/11/2018   No Known Allergies     Medication List    TAKE  these medications   5-HTP Caps Take 1 Dose by mouth daily.   acidophilus Caps capsule Take 1 capsule by mouth as directed.   APPLE CIDER VINEGAR PO Take 1 Dose by mouth as directed.   brompheniramine-pseudoephedrine-DM 30-2-10 MG/5ML syrup Take 5 mLs by mouth 4 (four) times daily as needed.   cyclobenzaprine 10 MG tablet Commonly known as:  FLEXERIL Take 1 tablet (10 mg total) by mouth 3 (three) times daily as needed.   dicyclomine 20 MG tablet Commonly known as:  BENTYL Take 1 tablet (20 mg total) by mouth 3 (three) times daily as needed (abdominal pain).   DIUREX PO Take 1 Dose by mouth as needed (water retention).   docusate sodium 100 MG capsule Commonly known as:  COLACE Take 100 mg by mouth daily as needed for mild constipation.   famotidine 20 MG tablet Commonly known as:  PEPCID Take 1 tablet (20 mg total) by mouth daily.   ibuprofen 800 MG tablet Commonly known as:  ADVIL,MOTRIN Take 1 tablet (800 mg total) by mouth every 8 (eight) hours as needed. What changed:    medication strength  how much to take  when to take this  Another medication with the same name was removed. Continue taking this medication, and follow the directions you see here.   loperamide 2 MG capsule Commonly known as:  IMODIUM Take 1 capsule (2 mg total) by mouth 4 (four) times daily as needed for diarrhea or loose stools.   MCT OIL PO Take 1 Dose by mouth as directed.   MELATONIN-THEANINE PO Take 1 Dose by mouth as directed.   methadone 10 MG/ML solution Commonly known as:  DOLOPHINE Take 135 mg by mouth daily. What changed:  Another medication with the same name was changed. Make sure you  understand how and when to take each.   methadone 10 MG/ML solution Commonly known as:  DOLOPHINE Take 10 mLs (100 mg total) by mouth daily. What changed:  how much to take   multivitamin with minerals Tabs tablet Take 1 tablet by mouth daily.   ondansetron 4 MG tablet Commonly known  as:  ZOFRAN Take 1 tablet (4 mg total) by mouth every 8 (eight) hours as needed.   oxyCODONE 5 MG immediate release tablet Commonly known as:  Oxy IR/ROXICODONE Take 1 tablet (5 mg total) by mouth every 6 (six) hours as needed for severe pain or breakthrough pain.   ST JOHNS WORT PO Take 1 Dose by mouth daily.   traMADol 50 MG tablet Commonly known as:  ULTRAM Take 1 tablet (50 mg total) by mouth every 6 (six) hours as needed for moderate pain.        Follow-up Information    Piscoya, Jacqulyn Bath, MD. Schedule an appointment as soon as possible for a visit in 2 week(s).   Specialty:  General Surgery Why:  s/p umbilical hernia repair Contact information: [address] [place] [address] [phone]            -- Edison Simon , PA-C [facility]  11/11/2018, 8:56 AM [phone] M-F: 7am - 4pm

## 2018-11-11 NOTE — Discharge Instructions (Signed)
In addition to included general post-operative instructions for umbilical hernia repair,  Diet: Resume home diet.   Activity: No heavy lifting >15 pounds (children, pets, laundry, garbage) or strenuous activity for 6 weeks, but light activity and walking are encouraged. Do not drive or drink alcohol if taking narcotic pain medications.  Wound care: 2 days after surgery (02/29), you may shower/get incision wet with soapy water and pat dry (do not rub incisions), but no baths or submerging incision underwater until follow-up.   Medications: Resume all home medications. For mild to moderate pain: acetaminophen (Tylenol) or ibuprofen/naproxen (if no kidney disease). Combining Tylenol with alcohol can substantially increase your risk of causing liver disease. Narcotic pain medications, if prescribed, can be used for severe pain, though may cause nausea, constipation, and drowsiness. Do not combine Tylenol and Percocet (or similar) within a 6 hour period as Percocet (and similar) contain(s) Tylenol. If you do not need the narcotic pain medication, you do not need to fill the prescription.  Call office [phone] / [phone]) at any time if any questions, worsening pain, fevers/chills, bleeding, drainage from incision site, or other concerns.

## 2018-11-25 ENCOUNTER — Other Ambulatory Visit: Payer: Self-pay

## 2018-11-25 ENCOUNTER — Ambulatory Visit (INDEPENDENT_AMBULATORY_CARE_PROVIDER_SITE_OTHER): Payer: Self-pay | Admitting: Surgery

## 2018-11-25 ENCOUNTER — Encounter: Payer: Self-pay | Admitting: Surgery

## 2018-11-25 VITALS — BP 124/79 | HR 65 | Temp 97.7°F | Ht 73.0 in | Wt 214.0 lb

## 2018-11-25 DIAGNOSIS — Z09 Encounter for follow-up examination after completed treatment for conditions other than malignant neoplasm: Secondary | ICD-10-CM

## 2018-11-25 DIAGNOSIS — K420 Umbilical hernia with obstruction, without gangrene: Secondary | ICD-10-CM

## 2018-11-25 NOTE — Progress Notes (Signed)
11/25/2018  HPI: Tommy Gomez is a 38 y.o. male s/p strangulated umbilical hernia repair on 11/10/18.  He presents for follow up.  Reports being sore at the incision but he's been active and moving at work.  Unfortunately he has been doing some lifting at work and has been sore in his incision.    Vital signs: BP 124/79   Pulse 65   Temp 97.7 F (36.5 C) (Skin)   Ht 6' 1"  (1.854 m)   Wt 214 lb (97.1 kg)   SpO2 98%   BMI 28.23 kg/m    Physical Exam: Constitutional:  No acute distress Abdomen:  Soft, non-distended, sore to palpation at the incision.  No evidence of hernia recurrence.  Incision is clean, dry, intact.   Assessment/Plan: This is a 38 y.o. male s/p strangulated umbilical hernia repair.  --Discussed with patient that he should avoid any heavy lifting of more than 10-15 lbs so that the incision can heal well and decrease the risk of a new hernia occurring.  Will write a note for work stating the same so he wont have to do that kind of activity until 4 weeks out of surgery. --Otherwise patient is healing well without complications. --Follow up prn   Melvyn Neth, [place]

## 2018-11-25 NOTE — Patient Instructions (Addendum)
Patient to return as needed.  No heavy lifting for the next two weeks.The patient is aware to call back for any questions or concerns.

## 2019-11-30 ENCOUNTER — Other Ambulatory Visit: Payer: Self-pay

## 2019-11-30 ENCOUNTER — Emergency Department: Payer: Self-pay

## 2019-11-30 ENCOUNTER — Emergency Department
Admission: EM | Admit: 2019-11-30 | Discharge: 2019-11-30 | Disposition: A | Discharge status: Home / Self Care | Payer: Self-pay | Attending: Emergency Medicine | Admitting: Emergency Medicine

## 2019-11-30 ENCOUNTER — Encounter: Payer: Self-pay | Admitting: Emergency Medicine

## 2019-11-30 DIAGNOSIS — Z1389 Encounter for screening for other disorder: Secondary | ICD-10-CM

## 2019-11-30 DIAGNOSIS — H532 Diplopia: Secondary | ICD-10-CM | POA: Insufficient documentation

## 2019-11-30 DIAGNOSIS — Z79899 Other long term (current) drug therapy: Secondary | ICD-10-CM | POA: Insufficient documentation

## 2019-11-30 DIAGNOSIS — H538 Other visual disturbances: Secondary | ICD-10-CM | POA: Insufficient documentation

## 2019-11-30 DIAGNOSIS — Z572 Occupational exposure to dust: Secondary | ICD-10-CM | POA: Insufficient documentation

## 2019-11-30 DIAGNOSIS — R42 Dizziness and giddiness: Secondary | ICD-10-CM | POA: Insufficient documentation

## 2019-11-30 DIAGNOSIS — I10 Essential (primary) hypertension: Secondary | ICD-10-CM | POA: Insufficient documentation

## 2019-11-30 LAB — CBC
HCT: 47.7 % (ref 39.0–52.0)
Hemoglobin: 16.1 g/dL (ref 13.0–17.0)
MCH: 30.5 pg (ref 26.0–34.0)
MCHC: 33.8 g/dL (ref 30.0–36.0)
MCV: 90.3 fL (ref 80.0–100.0)
Platelets: 172 10*3/uL (ref 150–400)
RBC: 5.28 MIL/uL (ref 4.22–5.81)
RDW: 12.4 % (ref 11.5–15.5)
WBC: 4.1 10*3/uL (ref 4.0–10.5)
nRBC: 0 % (ref 0.0–0.2)

## 2019-11-30 LAB — GLUCOSE, CAPILLARY: Glucose-Capillary: 78 mg/dL (ref 70–99)

## 2019-11-30 LAB — BASIC METABOLIC PANEL
Anion gap: 7 (ref 5–15)
BUN: 25 mg/dL — ABNORMAL HIGH (ref 6–20)
CO2: 31 mmol/L (ref 22–32)
Calcium: 9.1 mg/dL (ref 8.9–10.3)
Chloride: 99 mmol/L (ref 98–111)
Creatinine, Ser: 1.29 mg/dL — ABNORMAL HIGH (ref 0.61–1.24)
GFR calc Af Amer: >60 mL/min (ref >60)
GFR calc non Af Amer: >60 mL/min (ref >60)
Glucose, Bld: 92 mg/dL (ref 70–99)
Potassium: 4.6 mmol/L (ref 3.5–5.1)
Sodium: 137 mmol/L (ref 135–145)

## 2019-11-30 MED ORDER — MECLIZINE HCL 25 MG PO TABS: 25.0000 mg | ORAL_TABLET | Freq: Three times a day (TID) | ORAL | 1 refills | Status: AC | PRN

## 2019-11-30 MED ORDER — SODIUM CHLORIDE 0.9 % IV BOLUS
1000.0000 mL | Freq: Once | INTRAVENOUS | Status: AC
  Administered 2019-11-30: 1000 mL via INTRAVENOUS

## 2019-11-30 MED ORDER — DIAZEPAM 5 MG PO TABS: 5.0000 mg | ORAL_TABLET | Freq: Three times a day (TID) | ORAL | 0 refills | Status: AC | PRN

## 2019-11-30 MED ORDER — SODIUM CHLORIDE 0.9% FLUSH: 3.0000 mL | Freq: Once | INTRAVENOUS | Status: DC

## 2019-11-30 MED ORDER — PREDNISONE 20 MG PO TABS: 40.0000 mg | ORAL_TABLET | Freq: Every day | ORAL | 0 refills | Status: AC

## 2019-11-30 MED ORDER — GADOBUTROL 1 MMOL/ML IV SOLN
10.0000 mL | Freq: Once | INTRAVENOUS | Status: AC | PRN
  Administered 2019-11-30: 10 mL via INTRAVENOUS
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
## 2019-11-30 NOTE — ED Notes (Signed)
Pt given ascom so he can answer MRI screening questions.

## 2019-11-30 NOTE — Discharge Instructions (Signed)
Your MRI today was okay - there are no signs of stroke, multiple sclerosis, or tumor.  Please follow up with ENT for further evaluation of your vertigo symptoms.

## 2019-11-30 NOTE — ED Provider Notes (Signed)
[facility] Emergency Department Provider Note  ____________________________________________   First MD Initiated Contact with Patient 11/30/19 1423     (approximate)  I have reviewed the triage vital signs and the nursing notes.   HISTORY  Chief Complaint Loss of Consciousness    HPI CORDELRO GAUTREAU is a 39 y.o. male  With h/o HTN, Crohn's here with blurred vision and double vision. Pt states that over the past 2-3 months, he's had increasingly frequent episodes of dizziness and blurred vision. His blurred vision comes and goes, lasts up to several hours, then returns. He has some associated double vision with it. He has also had increasingly frequent episodes in which he becomes acutely dizzy, feels like his room is spinning, along with nausea. These tend to occur at work, worse when he moves his head and bends down. He saw [facility] ED for this recently and was transferred for MRI but left before this could be completed. No CP. No fevers. No other complaints.    Past Medical History:  Diagnosis Date  . Crohn disease ([place])   . Hypertension     Patient Active Problem List   Diagnosis Date Noted  . Incarcerated umbilical hernia 78/67/6720  . Strangulated umbilical hernia 94/70/9628  . Acute pancreatitis 09/28/2016    Past Surgical History:  Procedure Laterality Date  . FACIAL FRACTURE SURGERY    . FASCIOTOMY    . UMBILICAL HERNIA REPAIR N/A 11/10/2018   Procedure: HERNIA REPAIR UMBILICAL ADULT;  Surgeon: Olean Ree, MD;  Location: [facility] ORS;  Service: General;  Laterality: N/A;    Prior to Admission medications   Medication Sig Start Date End Date Taking? Authorizing Provider  5-HTP CAPS Take 1 Dose by mouth daily.    [provider]  acidophilus (RISAQUAD) CAPS capsule Take 1 capsule by mouth as directed.    [provider]  APPLE CIDER VINEGAR PO Take 1 Dose by mouth as directed.    [provider]    brompheniramine-pseudoephedrine-DM 30-2-10 MG/5ML syrup Take 5 mLs by mouth 4 (four) times daily as needed. 12/15/16   Sable Feil, PA-C  Caffeine-Magnesium Salicylate (DIUREX PO) Take 1 Dose by mouth as needed (water retention).    [provider]  cyclobenzaprine (FLEXERIL) 10 MG tablet Take 1 tablet (10 mg total) by mouth 3 (three) times daily as needed. 12/15/16   Sable Feil, PA-C  dicyclomine (BENTYL) 20 MG tablet Take 1 tablet (20 mg total) by mouth 3 (three) times daily as needed (abdominal pain). Patient not taking: Reported on 09/28/2016 10/08/15 10/07/16  Nance Pear, MD  docusate sodium (COLACE) 100 MG capsule Take 100 mg by mouth daily as needed for mild constipation.    [provider]  famotidine (PEPCID) 20 MG tablet Take 1 tablet (20 mg total) by mouth daily. 04/26/16 09/28/17  Hagler, Jami L, PA-C  ibuprofen (ADVIL,MOTRIN) 800 MG tablet Take 1 tablet (800 mg total) by mouth every 8 (eight) hours as needed. 11/11/18   Tylene Fantasia, PA-C  loperamide (IMODIUM) 2 MG capsule Take 1 capsule (2 mg total) by mouth 4 (four) times daily as needed for diarrhea or loose stools. 10/01/16   [name], MD  Medium Chain Triglycerides (MCT OIL PO) Take 1 Dose by mouth as directed.    [provider]  MELATONIN-THEANINE PO Take 1 Dose by mouth as directed.    [provider]  methadone (DOLOPHINE) 10 MG/ML solution Take 10 mLs (100 mg total) by  mouth daily. Patient taking differently: Take 120 mg by mouth daily.  10/01/16   [name], MD  methadone (DOLOPHINE) 10 MG/ML solution Take 135 mg by mouth daily.    [provider]  Multiple Vitamin (MULTIVITAMIN WITH MINERALS) TABS tablet Take 1 tablet by mouth daily.    [provider]  ondansetron (ZOFRAN) 4 MG tablet Take 1 tablet (4 mg total) by mouth every 8 (eight) hours as needed. 10/08/15   Nance Pear, MD  oxyCODONE (OXY IR/ROXICODONE) 5 MG immediate release tablet Take 1 tablet (5  mg total) by mouth every 6 (six) hours as needed for severe pain or breakthrough pain. 11/11/18   Tylene Fantasia, PA-C  ST JOHNS WORT PO Take 1 Dose by mouth daily.     [provider]  traMADol (ULTRAM) 50 MG tablet Take 1 tablet (50 mg total) by mouth every 6 (six) hours as needed for moderate pain. 12/15/16   Sable Feil, PA-C    Allergies Patient has no known allergies.  Family History  Problem Relation Age of Onset  . Hypertension Father     Social History Social History   Tobacco Use  . Smoking status: Never Smoker  . Smokeless tobacco: Current User  Substance Use Topics  . Alcohol use: Yes    Comment: occasional  . Drug use: No    Review of Systems  Review of Systems  Constitutional: Negative for chills, fatigue and fever.  HENT: Negative for sore throat.   Eyes: Positive for visual disturbance.  Respiratory: Negative for shortness of breath.   Cardiovascular: Negative for chest pain.  Gastrointestinal: Negative for abdominal pain.  Genitourinary: Negative for flank pain.  Musculoskeletal: Negative for neck pain.  Skin: Negative for rash and wound.  Allergic/Immunologic: Negative for immunocompromised state.  Neurological: Positive for dizziness. Negative for weakness and numbness.  Hematological: Does not bruise/bleed easily.  All other systems reviewed and are negative.    ____________________________________________  PHYSICAL EXAM:      VITAL SIGNS: ED Triage Vitals [11/30/19 1118]  Enc Vitals Group     BP 117/77     Pulse Rate 65     Resp 18     Temp 98.2 F (36.8 C)     Temp Source Oral     SpO2 94 %     Weight 228 lb (103.4 kg)     Height 6' 1"  (1.854 m)     Head Circumference      Peak Flow      Pain Score 0     Pain Loc      Pain Edu?      Excl. in [place]?      Physical Exam Vitals and nursing note reviewed.  Constitutional:      General: He is not in acute distress.    Appearance: He is well-developed.  HENT:     Head:  Normocephalic and atraumatic.  Eyes:     Conjunctiva/sclera: Conjunctivae normal.  Cardiovascular:     Rate and Rhythm: Normal rate and regular rhythm.     Heart sounds: Normal heart sounds.  Pulmonary:     Effort: Pulmonary effort is normal. No respiratory distress.     Breath sounds: No wheezing.  Abdominal:     General: There is no distension.  Musculoskeletal:     Cervical back: Neck supple.  Skin:    General: Skin is warm.     Capillary Refill: Capillary refill takes less than 2 seconds.  Findings: No rash.  Neurological:     Mental Status: He is alert and oriented to person, place, and time.     Motor: No abnormal muscle tone.     Comments: Neurological Exam:  Mental Status: Alert and oriented to person, place, and time. Attention and concentration normal. Speech clear. Recent memory is intact. Cranial Nerves: Visual fields grossly intact. EOMI and PERRLA. Mildly disconjugate gaze noted with +Skew. No overt nystagmus. Facial sensation intact at forehead, maxillary cheek, and chin/mandible bilaterally. No facial asymmetry or weakness. Hearing grossly normal. Uvula is midline, and palate elevates symmetrically. Normal SCM and trapezius strength. Tongue midline without fasciculations. Motor: Muscle strength 5/5 in proximal and distal UE and LE bilaterally. No pronator drift. Muscle tone normal. Reflexes: 2+ and symmetric  Sensation: Intact to light touch in upper and lower extremities distally bilaterally.  Gait: Normal without ataxia. Coordination: Normal FTN bilaterally.          ____________________________________________   LABS (all labs ordered are listed, but only abnormal results are displayed)  Labs Reviewed  BASIC METABOLIC PANEL - Abnormal; Notable for the following components:      Result Value   BUN 25 (*)    Creatinine, Ser 1.29 (*)    All other components within normal limits  CBC  GLUCOSE, CAPILLARY  URINALYSIS, COMPLETE (UACMP) WITH MICROSCOPIC    CBG MONITORING, ED    ____________________________________________  EKG: Normal sinus rhythm, VR 72. PR 160, QRS 102, QTc 413. No acute St elevations or depressions. No ischemia or infarct. ________________________________________  RADIOLOGY All imaging, including plain films, CT scans, and ultrasounds, independently reviewed by me, and interpretations confirmed via formal radiology reads.  ED MD interpretation:   MR Brain: Pending  Official radiology report(s): No results found.  ____________________________________________  PROCEDURES   Procedure(s) performed (including Critical Care):  Procedures  ____________________________________________  INITIAL IMPRESSION / MDM / [place] / ED COURSE  As part of my medical decision making, I reviewed the following data within the [place] notes reviewed and incorporated, Old chart reviewed, Notes from prior ED visits, and [place] Controlled Substance Database       *NGUYEN TODOROV was evaluated in Emergency Department on 11/30/2019 for the symptoms described in the history of present illness. He was evaluated in the context of the global COVID-19 pandemic, which necessitated consideration that the patient might be at risk for infection with the SARS-CoV-2 virus that causes COVID-19. Institutional protocols and algorithms that pertain to the evaluation of patients at risk for COVID-19 are in a state of rapid change based on information released by regulatory bodies including the CDC and federal and state organizations. These policies and algorithms were followed during the patient's care in the ED.  Some ED evaluations and interventions may be delayed as a result of limited staffing during the pandemic.*     Medical Decision Making:  39 yo M here with intermittent diplopia, vertigo. 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 However, given unknown h/o strabismus (has been told he needed glasses previously but does not follow-up), and based on normal CT with neuro consult recommendations from [facility] to [facility] recommending MRI, will check MRI to eval for MS, stroke, space-occupying lesions.   If negative, would consider short course of valium PRN, prednisone and ENT referral.  ____________________________________________  FINAL CLINICAL IMPRESSION(S) / ED DIAGNOSES  Final  diagnoses:  Vertigo  Blurred vision     MEDICATIONS GIVEN DURING THIS VISIT:  Medications  sodium chloride flush (NS) 0.9 % injection 3 mL (has no administration in time range)  sodium chloride 0.9 % bolus 1,000 mL (1,000 mLs Intravenous New Bag/Given 11/30/19 1529)     ED Discharge Orders    None       Note:  This document was prepared using Dragon voice recognition software and may include unintentional dictation errors.   Duffy Bruce, MD 11/30/19 1534

## 2019-11-30 NOTE — ED Triage Notes (Signed)
Pt in via EMS from work with c/o a syncopal episode. EMS reports pt hit head on floor. Pts boss said he was unconscious for 10 minutes. EKG NSR, SBG 120, BP 142/75, HR 79. Pt started on meclizine for vertigo last week.

## 2019-11-30 NOTE — ED Triage Notes (Signed)
Pt presents to ED via [facility] with c/o syncopal episode earlier today. Pt states seen last week at [facility] for same and dx with vertigo. Pt states has been intermittently dizzy x several months. Pt states CT and lab work were okay at [facility] last week. Pt states unsure if they wanted follow up or MRI from [facility].   Pt states today was looking down sweeping the floor while he was at work and began to feel dizzy, so he sat down and had LOC. Pt states fell and hit head on floor.  In triage pt A&O x 4, NAD noted. Able to speak in clear and complete sentences at this time. Pt states did not take his Meclizine this morning prior to going to work but has been eating and attempting to stay hydrated while at work.

## 2019-11-30 NOTE — ED Notes (Signed)
Pt up to bedside toilet. Steady. Family at bedside.

## 2021-03-29 IMAGING — MR MR HEAD WO/W CM
10 of 12 series · 39 of 48 positions shown · IV contrast (gadavist)
Comparison: Head CT 06/07/2010

CLINICAL DATA: Diplopia, double vision and vertigo over the last
several months.

EXAM:
MRI HEAD WITHOUT AND WITH CONTRAST
TECHNIQUE: Multiplanar, multiecho pulse sequences of the brain and surrounding
structures were obtained without and with intravenous contrast.
CONTRAST:  10mL GADAVIST GADOBUTROL 1 MMOL/ML IV SOLN

[Series 5: ax dwi_tracew · axial · 3.0mm · 0.60mm/px · z∈[-58,+103]mm · 7 of 50 slices shown]
[im 1/50]
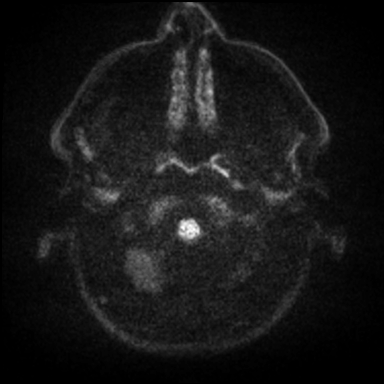
[im 9/50]
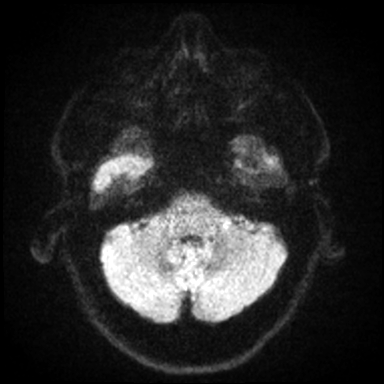
[im 17/50]
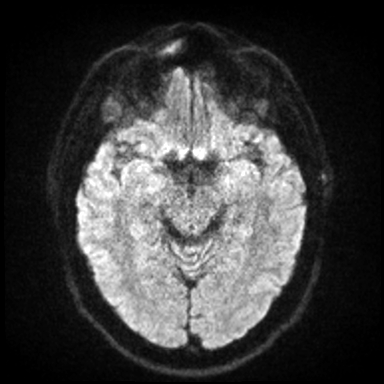
[im 25/50]
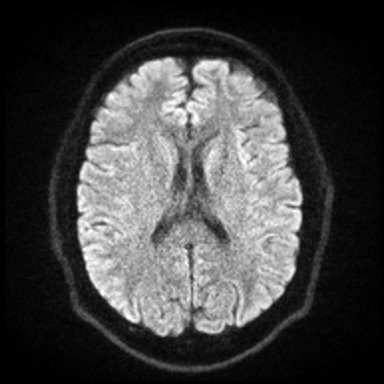
[im 33/50]
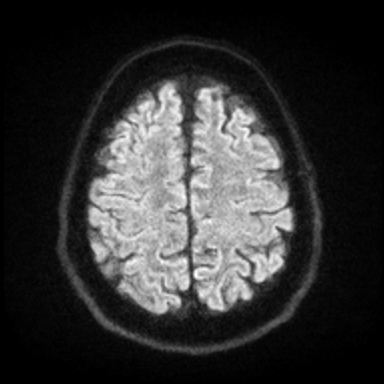
[im 41/50]
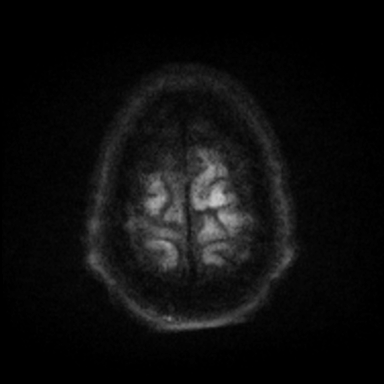
[im 50/50]
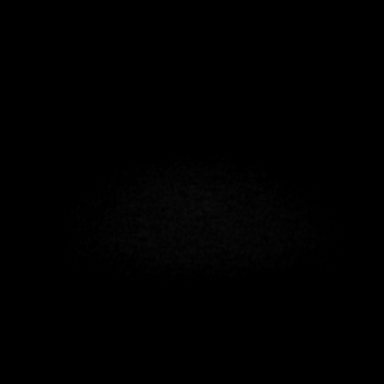

[Series 6: ax dwi_adc · axial · 3.0mm · 0.60mm/px · z∈[-58,+96]mm · 5 of 46 slices shown]
[im 1/46]
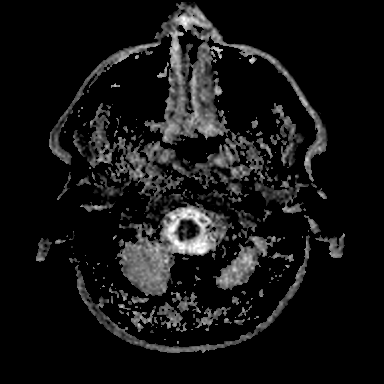
[im 12/46]
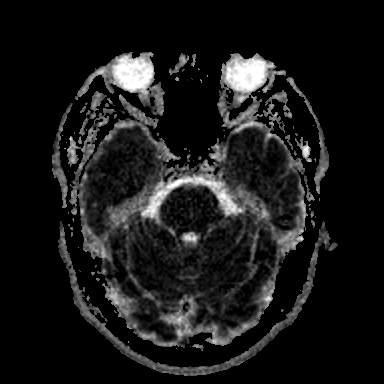
[im 23/46]
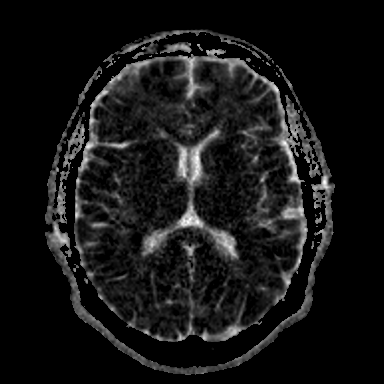
[im 34/46]
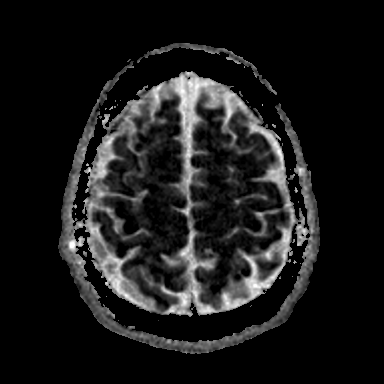
[im 46/46]
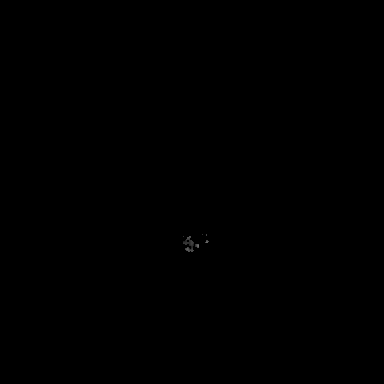

[Series 7: cor dwi_tracew · coronal · 5.0mm · 0.60mm/px · 4 of 40 slices shown]
[im 1/40]
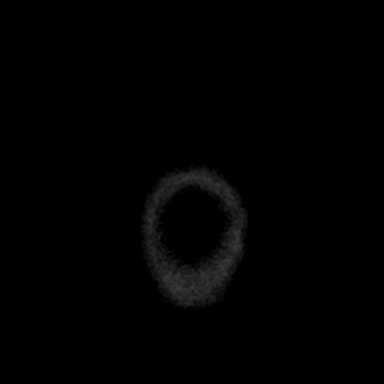
[im 10/40]
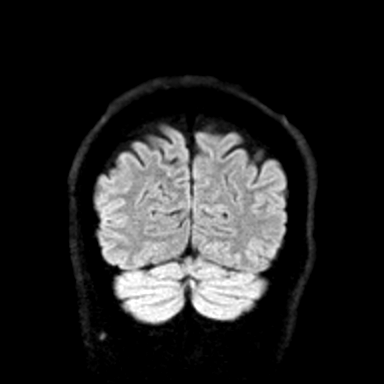
[im 20/40]
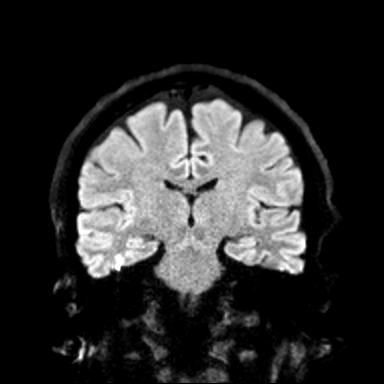
[im 30/40]
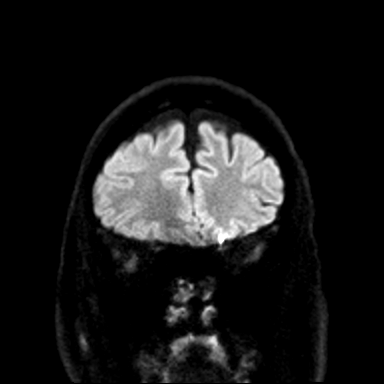

[Series 10: T1 · sagittal · 5.0mm · 0.94mm/px · 3 of 25 slices shown (1 of 2)]
[im 1/25]
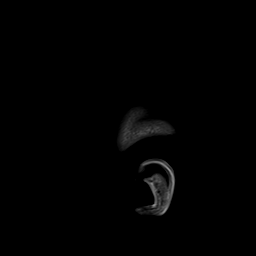
[im 13/25]
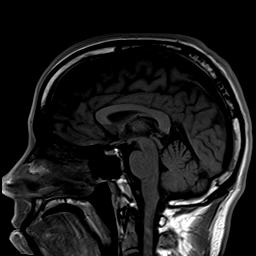
[im 25/25]
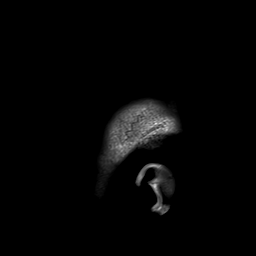

[Series 11: T2 · axial · 5.0mm · 0.45mm/px · z∈[-75,+99]mm · 3 of 30 slices shown (1 of 2)]
[im 1/30]
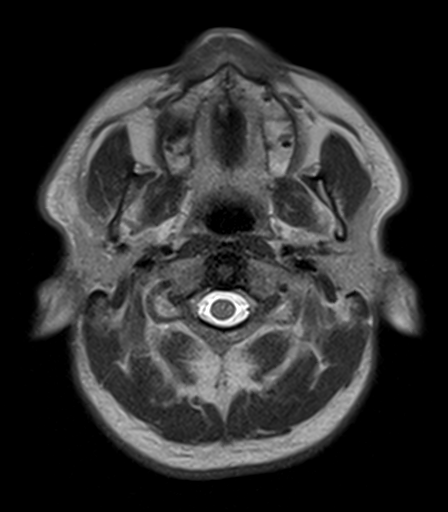
[im 15/30]
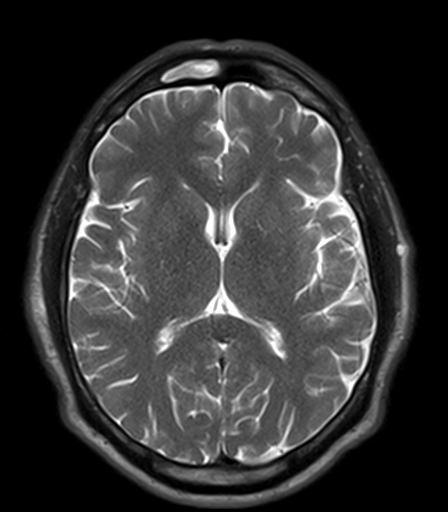
[im 30/30]
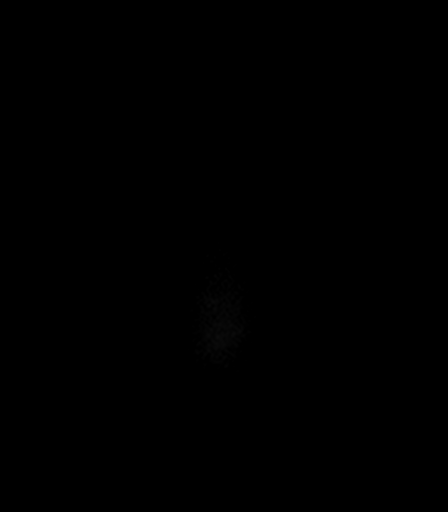

[Series 13: FLAIR · axial · 5.0mm · 1.20mm/px · z∈[-75,+99]mm · 3 of 30 slices shown]
[im 1/30]
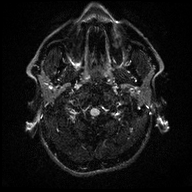
[im 15/30]
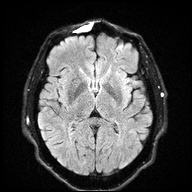
[im 30/30]
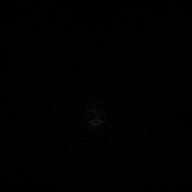

[Series 14: T1 · axial · 5.0mm · 0.90mm/px · z∈[-65,+109]mm · 3 of 29 slices shown (2 of 2)]
[im 1/29]
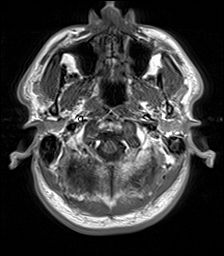
[im 15/29]
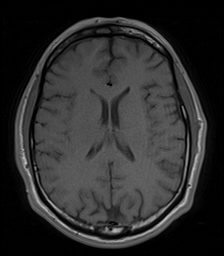
[im 29/29]
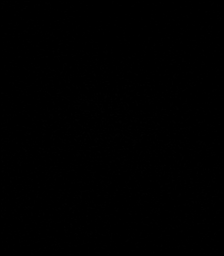

[Series 15: T2 · coronal · 5.0mm · 0.45mm/px · 4 of 32 slices shown (2 of 2)]
[im 1/32]
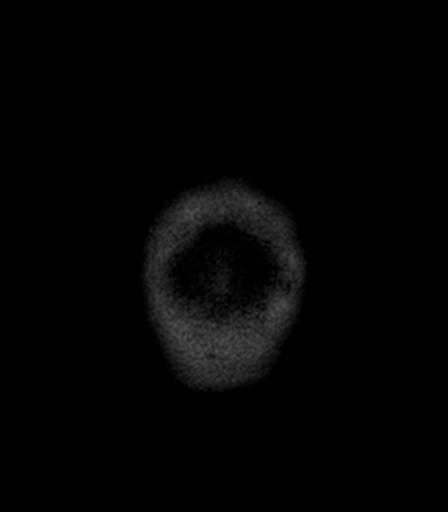
[im 11/32]
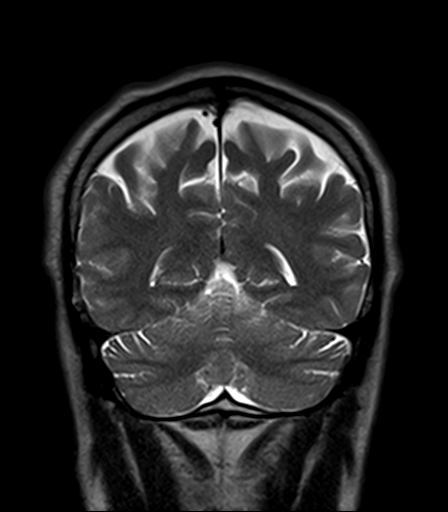
[im 21/32]
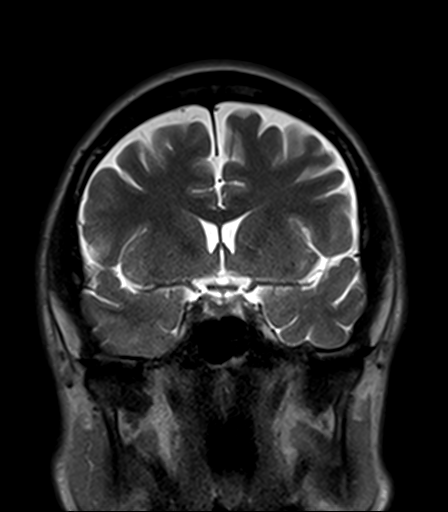
[im 32/32]
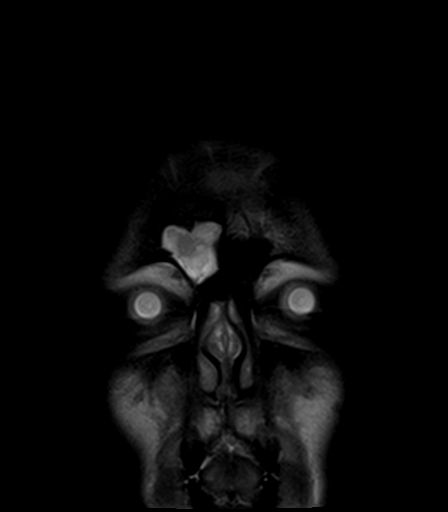

[Series 16: T1 post-contrast · axial · 5.0mm · 0.90mm/px · z∈[-75,+99]mm · 3 of 30 slices shown (1 of 2)]
[im 1/30]
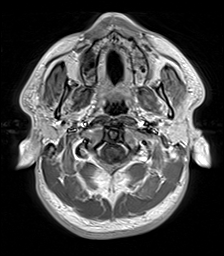
[im 15/30]
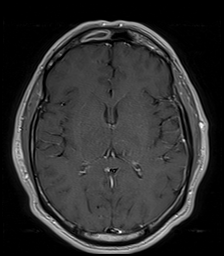
[im 30/30]
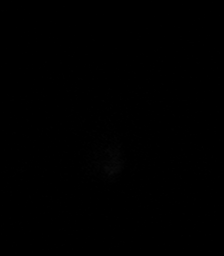

[Series 17: T1 post-contrast · coronal · 5.0mm · 0.90mm/px · 4 of 32 slices shown (2 of 2)]
[im 1/32]
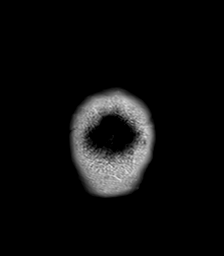
[im 11/32]
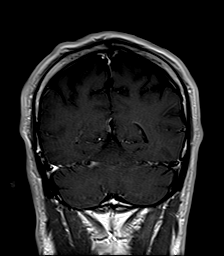
[im 21/32]
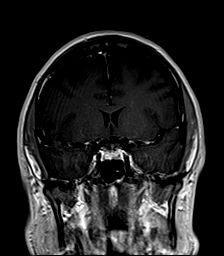
[im 32/32]
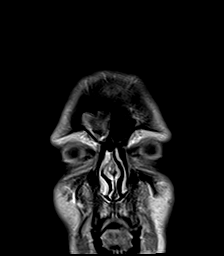

[39 of 48 positions shown; findings below may reference images not displayed]

FINDINGS: Brain: Diffusion imaging does not show any acute or subacute
infarction. The brainstem and cerebellum are normal. Cerebral
hemispheres are normal except for 2 or 3 tiny foci of T2 and FLAIR
signal within the left frontal white matter, often seen in healthy
individuals. Pattern is not specifically suggestive of multiple
sclerosis. No cortical abnormality. No mass lesion, hemorrhage,
hydrocephalus or extra-axial collection. After contrast
administration, no abnormal enhancement occurs.

Vascular: Major vessels at the base of the brain show flow.

Skull and upper cervical spine: Negative

Sinuses/Orbits: Right frontal sinus opacification. The other sinuses
are clear. Orbits are negative.

Other: None
IMPRESSION: No acute intracranial finding. Brain appearance is normal with
exception of 2 or 3 punctate foci of T2 and FLAIR signal within the
left frontal white matter, often seen in usually not significant.
The pattern is not specifically suggestive of multiple sclerosis.

Right frontal sinus opacification.
